# Patient Record
Sex: Female | Born: 1945
Health system: Southern US, Community
[De-identification: ages and names within clinical notes are randomized; demographics above are authoritative.]

## PROBLEM LIST (undated history)

## (undated) ENCOUNTER — Emergency Department (HOSPITAL_COMMUNITY): Admission: EM | Payer: Self-pay | Source: Home / Self Care

## (undated) DIAGNOSIS — I2119 ST elevation (STEMI) myocardial infarction involving other coronary artery of inferior wall: Secondary | ICD-10-CM

## (undated) DIAGNOSIS — J302 Other seasonal allergic rhinitis: Secondary | ICD-10-CM

## (undated) DIAGNOSIS — E785 Hyperlipidemia, unspecified: Secondary | ICD-10-CM

## (undated) DIAGNOSIS — Z720 Tobacco use: Secondary | ICD-10-CM

## (undated) DIAGNOSIS — I219 Acute myocardial infarction, unspecified: Secondary | ICD-10-CM

## (undated) DIAGNOSIS — I10 Essential (primary) hypertension: Secondary | ICD-10-CM

## (undated) DIAGNOSIS — I2510 Atherosclerotic heart disease of native coronary artery without angina pectoris: Secondary | ICD-10-CM

## (undated) DIAGNOSIS — Z955 Presence of coronary angioplasty implant and graft: Secondary | ICD-10-CM

## (undated) HISTORY — DX: ST elevation (STEMI) myocardial infarction involving other coronary artery of inferior wall: I21.19

## (undated) HISTORY — DX: Essential (primary) hypertension: I10

## (undated) HISTORY — DX: Presence of coronary angioplasty implant and graft: Z95.5

## (undated) HISTORY — PX: CATARACT EXTRACTION: SUR2

## (undated) HISTORY — PX: CHOLECYSTECTOMY: SHX55

## (undated) HISTORY — DX: Other seasonal allergic rhinitis: J30.2

## (undated) HISTORY — PX: COLONOSCOPY: SHX174

## (undated) HISTORY — PX: VAGINAL HYSTERECTOMY: SUR661

---

## 2012-08-21 DIAGNOSIS — E782 Mixed hyperlipidemia: Secondary | ICD-10-CM | POA: Diagnosis not present

## 2012-12-11 DIAGNOSIS — J0100 Acute maxillary sinusitis, unspecified: Secondary | ICD-10-CM | POA: Diagnosis not present

## 2013-02-01 DIAGNOSIS — J209 Acute bronchitis, unspecified: Secondary | ICD-10-CM | POA: Diagnosis not present

## 2013-02-20 DIAGNOSIS — R109 Unspecified abdominal pain: Secondary | ICD-10-CM | POA: Diagnosis not present

## 2013-02-20 DIAGNOSIS — G518 Other disorders of facial nerve: Secondary | ICD-10-CM | POA: Diagnosis not present

## 2013-04-07 DIAGNOSIS — Z79899 Other long term (current) drug therapy: Secondary | ICD-10-CM | POA: Diagnosis not present

## 2013-04-07 DIAGNOSIS — E7800 Pure hypercholesterolemia, unspecified: Secondary | ICD-10-CM | POA: Diagnosis not present

## 2013-04-09 DIAGNOSIS — R509 Fever, unspecified: Secondary | ICD-10-CM | POA: Diagnosis not present

## 2013-04-09 DIAGNOSIS — R112 Nausea with vomiting, unspecified: Secondary | ICD-10-CM | POA: Diagnosis not present

## 2013-09-11 DIAGNOSIS — Z1231 Encounter for screening mammogram for malignant neoplasm of breast: Secondary | ICD-10-CM | POA: Diagnosis not present

## 2013-09-15 DIAGNOSIS — J019 Acute sinusitis, unspecified: Secondary | ICD-10-CM | POA: Diagnosis not present

## 2013-11-17 DIAGNOSIS — J0100 Acute maxillary sinusitis, unspecified: Secondary | ICD-10-CM | POA: Diagnosis not present

## 2013-11-26 DIAGNOSIS — J0100 Acute maxillary sinusitis, unspecified: Secondary | ICD-10-CM | POA: Diagnosis not present

## 2013-11-26 DIAGNOSIS — J209 Acute bronchitis, unspecified: Secondary | ICD-10-CM | POA: Diagnosis not present

## 2014-05-11 DIAGNOSIS — Z79899 Other long term (current) drug therapy: Secondary | ICD-10-CM | POA: Diagnosis not present

## 2014-05-11 DIAGNOSIS — J0100 Acute maxillary sinusitis, unspecified: Secondary | ICD-10-CM | POA: Diagnosis not present

## 2014-05-11 DIAGNOSIS — E782 Mixed hyperlipidemia: Secondary | ICD-10-CM | POA: Diagnosis not present

## 2014-05-11 DIAGNOSIS — Z1389 Encounter for screening for other disorder: Secondary | ICD-10-CM | POA: Diagnosis not present

## 2014-05-11 DIAGNOSIS — M810 Age-related osteoporosis without current pathological fracture: Secondary | ICD-10-CM | POA: Diagnosis not present

## 2014-05-26 DIAGNOSIS — J0100 Acute maxillary sinusitis, unspecified: Secondary | ICD-10-CM | POA: Diagnosis not present

## 2014-07-24 DIAGNOSIS — J209 Acute bronchitis, unspecified: Secondary | ICD-10-CM | POA: Diagnosis not present

## 2014-07-24 DIAGNOSIS — R071 Chest pain on breathing: Secondary | ICD-10-CM | POA: Diagnosis not present

## 2014-07-24 DIAGNOSIS — J0100 Acute maxillary sinusitis, unspecified: Secondary | ICD-10-CM | POA: Diagnosis not present

## 2014-07-30 DIAGNOSIS — J208 Acute bronchitis due to other specified organisms: Secondary | ICD-10-CM | POA: Diagnosis not present

## 2014-07-30 DIAGNOSIS — B9689 Other specified bacterial agents as the cause of diseases classified elsewhere: Secondary | ICD-10-CM | POA: Diagnosis not present

## 2014-09-03 DIAGNOSIS — T50995A Adverse effect of other drugs, medicaments and biological substances, initial encounter: Secondary | ICD-10-CM | POA: Diagnosis not present

## 2014-09-03 DIAGNOSIS — R300 Dysuria: Secondary | ICD-10-CM | POA: Diagnosis not present

## 2014-09-09 DIAGNOSIS — J209 Acute bronchitis, unspecified: Secondary | ICD-10-CM | POA: Diagnosis not present

## 2014-09-16 DIAGNOSIS — J302 Other seasonal allergic rhinitis: Secondary | ICD-10-CM | POA: Diagnosis not present

## 2014-09-16 DIAGNOSIS — I10 Essential (primary) hypertension: Secondary | ICD-10-CM | POA: Diagnosis not present

## 2014-11-17 DIAGNOSIS — Z1231 Encounter for screening mammogram for malignant neoplasm of breast: Secondary | ICD-10-CM | POA: Diagnosis not present

## 2014-11-19 DIAGNOSIS — B373 Candidiasis of vulva and vagina: Secondary | ICD-10-CM | POA: Diagnosis not present

## 2014-11-19 DIAGNOSIS — N3001 Acute cystitis with hematuria: Secondary | ICD-10-CM | POA: Diagnosis not present

## 2014-11-19 DIAGNOSIS — M810 Age-related osteoporosis without current pathological fracture: Secondary | ICD-10-CM | POA: Diagnosis not present

## 2014-12-17 DIAGNOSIS — C44112 Basal cell carcinoma of skin of right eyelid, including canthus: Secondary | ICD-10-CM | POA: Diagnosis not present

## 2014-12-20 DIAGNOSIS — Z7982 Long term (current) use of aspirin: Secondary | ICD-10-CM | POA: Diagnosis not present

## 2014-12-20 DIAGNOSIS — Z79899 Other long term (current) drug therapy: Secondary | ICD-10-CM | POA: Diagnosis not present

## 2014-12-20 DIAGNOSIS — S299XXA Unspecified injury of thorax, initial encounter: Secondary | ICD-10-CM | POA: Diagnosis not present

## 2014-12-20 DIAGNOSIS — S80212A Abrasion, left knee, initial encounter: Secondary | ICD-10-CM | POA: Diagnosis not present

## 2014-12-20 DIAGNOSIS — E780 Pure hypercholesterolemia: Secondary | ICD-10-CM | POA: Diagnosis not present

## 2014-12-20 DIAGNOSIS — S8992XA Unspecified injury of left lower leg, initial encounter: Secondary | ICD-10-CM | POA: Diagnosis not present

## 2014-12-20 DIAGNOSIS — S51011A Laceration without foreign body of right elbow, initial encounter: Secondary | ICD-10-CM | POA: Diagnosis not present

## 2014-12-20 DIAGNOSIS — R0781 Pleurodynia: Secondary | ICD-10-CM | POA: Diagnosis not present

## 2014-12-20 DIAGNOSIS — M25562 Pain in left knee: Secondary | ICD-10-CM | POA: Diagnosis not present

## 2014-12-23 DIAGNOSIS — L03113 Cellulitis of right upper limb: Secondary | ICD-10-CM | POA: Diagnosis not present

## 2014-12-23 DIAGNOSIS — M25521 Pain in right elbow: Secondary | ICD-10-CM | POA: Diagnosis not present

## 2015-01-26 DIAGNOSIS — J209 Acute bronchitis, unspecified: Secondary | ICD-10-CM | POA: Diagnosis not present

## 2015-01-26 DIAGNOSIS — J0100 Acute maxillary sinusitis, unspecified: Secondary | ICD-10-CM | POA: Diagnosis not present

## 2015-02-02 DIAGNOSIS — J019 Acute sinusitis, unspecified: Secondary | ICD-10-CM | POA: Diagnosis not present

## 2015-02-02 DIAGNOSIS — B9689 Other specified bacterial agents as the cause of diseases classified elsewhere: Secondary | ICD-10-CM | POA: Diagnosis not present

## 2015-02-17 DIAGNOSIS — N3001 Acute cystitis with hematuria: Secondary | ICD-10-CM | POA: Diagnosis not present

## 2015-02-18 DIAGNOSIS — L570 Actinic keratosis: Secondary | ICD-10-CM | POA: Diagnosis not present

## 2015-05-04 DIAGNOSIS — H40013 Open angle with borderline findings, low risk, bilateral: Secondary | ICD-10-CM | POA: Diagnosis not present

## 2015-05-04 DIAGNOSIS — H2512 Age-related nuclear cataract, left eye: Secondary | ICD-10-CM | POA: Diagnosis not present

## 2015-05-11 DIAGNOSIS — M1990 Unspecified osteoarthritis, unspecified site: Secondary | ICD-10-CM | POA: Diagnosis not present

## 2015-05-11 DIAGNOSIS — F17210 Nicotine dependence, cigarettes, uncomplicated: Secondary | ICD-10-CM | POA: Diagnosis not present

## 2015-05-11 DIAGNOSIS — M810 Age-related osteoporosis without current pathological fracture: Secondary | ICD-10-CM | POA: Diagnosis not present

## 2015-05-11 DIAGNOSIS — E785 Hyperlipidemia, unspecified: Secondary | ICD-10-CM | POA: Diagnosis not present

## 2015-05-11 DIAGNOSIS — Z79899 Other long term (current) drug therapy: Secondary | ICD-10-CM | POA: Diagnosis not present

## 2015-05-11 DIAGNOSIS — H2512 Age-related nuclear cataract, left eye: Secondary | ICD-10-CM | POA: Diagnosis not present

## 2015-05-25 DIAGNOSIS — H2511 Age-related nuclear cataract, right eye: Secondary | ICD-10-CM | POA: Diagnosis not present

## 2015-05-25 DIAGNOSIS — Z79899 Other long term (current) drug therapy: Secondary | ICD-10-CM | POA: Diagnosis not present

## 2015-05-25 DIAGNOSIS — E785 Hyperlipidemia, unspecified: Secondary | ICD-10-CM | POA: Diagnosis not present

## 2015-05-25 DIAGNOSIS — M810 Age-related osteoporosis without current pathological fracture: Secondary | ICD-10-CM | POA: Diagnosis not present

## 2015-08-04 DIAGNOSIS — S92919A Unspecified fracture of unspecified toe(s), initial encounter for closed fracture: Secondary | ICD-10-CM | POA: Diagnosis not present

## 2015-08-04 DIAGNOSIS — S52509A Unspecified fracture of the lower end of unspecified radius, initial encounter for closed fracture: Secondary | ICD-10-CM | POA: Diagnosis not present

## 2015-08-05 DIAGNOSIS — S52501A Unspecified fracture of the lower end of right radius, initial encounter for closed fracture: Secondary | ICD-10-CM | POA: Diagnosis not present

## 2015-08-05 DIAGNOSIS — E785 Hyperlipidemia, unspecified: Secondary | ICD-10-CM | POA: Diagnosis not present

## 2015-08-05 DIAGNOSIS — Z79899 Other long term (current) drug therapy: Secondary | ICD-10-CM | POA: Diagnosis not present

## 2015-08-05 DIAGNOSIS — Z7982 Long term (current) use of aspirin: Secondary | ICD-10-CM | POA: Diagnosis not present

## 2015-08-05 DIAGNOSIS — S52571A Other intraarticular fracture of lower end of right radius, initial encounter for closed fracture: Secondary | ICD-10-CM | POA: Diagnosis not present

## 2015-08-05 DIAGNOSIS — Z8719 Personal history of other diseases of the digestive system: Secondary | ICD-10-CM | POA: Diagnosis not present

## 2015-08-05 DIAGNOSIS — Z87891 Personal history of nicotine dependence: Secondary | ICD-10-CM | POA: Diagnosis not present

## 2015-08-18 DIAGNOSIS — S52509A Unspecified fracture of the lower end of unspecified radius, initial encounter for closed fracture: Secondary | ICD-10-CM | POA: Diagnosis not present

## 2015-09-29 DIAGNOSIS — S52509A Unspecified fracture of the lower end of unspecified radius, initial encounter for closed fracture: Secondary | ICD-10-CM | POA: Diagnosis not present

## 2016-01-14 DIAGNOSIS — J309 Allergic rhinitis, unspecified: Secondary | ICD-10-CM | POA: Diagnosis not present

## 2016-01-14 DIAGNOSIS — Z79899 Other long term (current) drug therapy: Secondary | ICD-10-CM | POA: Diagnosis not present

## 2016-01-14 DIAGNOSIS — Z0001 Encounter for general adult medical examination with abnormal findings: Secondary | ICD-10-CM | POA: Diagnosis not present

## 2016-01-14 DIAGNOSIS — Z1389 Encounter for screening for other disorder: Secondary | ICD-10-CM | POA: Diagnosis not present

## 2016-01-14 DIAGNOSIS — Z9181 History of falling: Secondary | ICD-10-CM | POA: Diagnosis not present

## 2016-01-21 DIAGNOSIS — G501 Atypical facial pain: Secondary | ICD-10-CM | POA: Diagnosis not present

## 2016-01-21 DIAGNOSIS — M79643 Pain in unspecified hand: Secondary | ICD-10-CM | POA: Diagnosis not present

## 2016-01-24 ENCOUNTER — Other Ambulatory Visit: Payer: Self-pay

## 2016-01-24 DIAGNOSIS — S0083XS Contusion of other part of head, sequela: Secondary | ICD-10-CM | POA: Diagnosis not present

## 2016-03-05 DIAGNOSIS — J0100 Acute maxillary sinusitis, unspecified: Secondary | ICD-10-CM | POA: Diagnosis not present

## 2016-05-09 DIAGNOSIS — H9209 Otalgia, unspecified ear: Secondary | ICD-10-CM | POA: Diagnosis not present

## 2016-05-09 DIAGNOSIS — J0100 Acute maxillary sinusitis, unspecified: Secondary | ICD-10-CM | POA: Diagnosis not present

## 2016-07-26 DIAGNOSIS — E559 Vitamin D deficiency, unspecified: Secondary | ICD-10-CM | POA: Diagnosis not present

## 2016-07-26 DIAGNOSIS — R531 Weakness: Secondary | ICD-10-CM | POA: Diagnosis not present

## 2016-07-26 DIAGNOSIS — R634 Abnormal weight loss: Secondary | ICD-10-CM | POA: Diagnosis not present

## 2016-07-26 DIAGNOSIS — Z760 Encounter for issue of repeat prescription: Secondary | ICD-10-CM | POA: Diagnosis not present

## 2016-08-04 DIAGNOSIS — R634 Abnormal weight loss: Secondary | ICD-10-CM | POA: Diagnosis not present

## 2016-08-04 DIAGNOSIS — K8020 Calculus of gallbladder without cholecystitis without obstruction: Secondary | ICD-10-CM | POA: Diagnosis not present

## 2016-08-21 DIAGNOSIS — K8020 Calculus of gallbladder without cholecystitis without obstruction: Secondary | ICD-10-CM | POA: Diagnosis not present

## 2016-08-21 DIAGNOSIS — Z6823 Body mass index (BMI) 23.0-23.9, adult: Secondary | ICD-10-CM | POA: Diagnosis not present

## 2016-08-29 DIAGNOSIS — K8010 Calculus of gallbladder with chronic cholecystitis without obstruction: Secondary | ICD-10-CM | POA: Insufficient documentation

## 2016-08-29 DIAGNOSIS — R1011 Right upper quadrant pain: Secondary | ICD-10-CM | POA: Insufficient documentation

## 2016-09-06 DIAGNOSIS — K8010 Calculus of gallbladder with chronic cholecystitis without obstruction: Secondary | ICD-10-CM | POA: Diagnosis not present

## 2016-09-06 DIAGNOSIS — Z01818 Encounter for other preprocedural examination: Secondary | ICD-10-CM | POA: Diagnosis not present

## 2016-09-06 DIAGNOSIS — K8020 Calculus of gallbladder without cholecystitis without obstruction: Secondary | ICD-10-CM | POA: Diagnosis not present

## 2016-11-03 DIAGNOSIS — S30860A Insect bite (nonvenomous) of lower back and pelvis, initial encounter: Secondary | ICD-10-CM | POA: Diagnosis not present

## 2016-11-03 DIAGNOSIS — R509 Fever, unspecified: Secondary | ICD-10-CM | POA: Diagnosis not present

## 2016-11-17 ENCOUNTER — Inpatient Hospital Stay (HOSPITAL_COMMUNITY)
Admission: EM | Admit: 2016-11-17 | Discharge: 2016-11-19 | DRG: 247 | Disposition: A | Discharge status: Home / Self Care | Payer: 59 | Attending: Cardiovascular Disease | Admitting: Cardiovascular Disease

## 2016-11-17 ENCOUNTER — Encounter (HOSPITAL_COMMUNITY): Payer: Self-pay | Admitting: Cardiovascular Disease

## 2016-11-17 ENCOUNTER — Encounter (HOSPITAL_COMMUNITY)
Admission: EM | Disposition: A | Discharge status: Home / Self Care | Payer: Self-pay | Source: Home / Self Care | Attending: Cardiovascular Disease

## 2016-11-17 DIAGNOSIS — Z79899 Other long term (current) drug therapy: Secondary | ICD-10-CM | POA: Diagnosis not present

## 2016-11-17 DIAGNOSIS — E7800 Pure hypercholesterolemia, unspecified: Secondary | ICD-10-CM | POA: Diagnosis not present

## 2016-11-17 DIAGNOSIS — F17210 Nicotine dependence, cigarettes, uncomplicated: Secondary | ICD-10-CM | POA: Diagnosis present

## 2016-11-17 DIAGNOSIS — Z955 Presence of coronary angioplasty implant and graft: Secondary | ICD-10-CM

## 2016-11-17 DIAGNOSIS — Z720 Tobacco use: Secondary | ICD-10-CM | POA: Diagnosis not present

## 2016-11-17 DIAGNOSIS — I360 Nonrheumatic tricuspid (valve) stenosis: Secondary | ICD-10-CM | POA: Diagnosis not present

## 2016-11-17 DIAGNOSIS — I2119 ST elevation (STEMI) myocardial infarction involving other coronary artery of inferior wall: Principal | ICD-10-CM

## 2016-11-17 DIAGNOSIS — E785 Hyperlipidemia, unspecified: Secondary | ICD-10-CM | POA: Diagnosis present

## 2016-11-17 DIAGNOSIS — I2510 Atherosclerotic heart disease of native coronary artery without angina pectoris: Secondary | ICD-10-CM | POA: Diagnosis present

## 2016-11-17 DIAGNOSIS — R079 Chest pain, unspecified: Secondary | ICD-10-CM | POA: Diagnosis not present

## 2016-11-17 DIAGNOSIS — I2111 ST elevation (STEMI) myocardial infarction involving right coronary artery: Secondary | ICD-10-CM | POA: Diagnosis not present

## 2016-11-17 DIAGNOSIS — Z7982 Long term (current) use of aspirin: Secondary | ICD-10-CM | POA: Diagnosis not present

## 2016-11-17 HISTORY — DX: Hyperlipidemia, unspecified: E78.5

## 2016-11-17 HISTORY — PX: CORONARY STENT INTERVENTION: CATH118234

## 2016-11-17 HISTORY — DX: Tobacco use: Z72.0

## 2016-11-17 HISTORY — DX: Atherosclerotic heart disease of native coronary artery without angina pectoris: I25.10

## 2016-11-17 HISTORY — PX: LEFT HEART CATH AND CORONARY ANGIOGRAPHY: CATH118249

## 2016-11-17 LAB — CBC
HEMATOCRIT: 35.4 % — AB (ref 36.0–46.0)
Hemoglobin: 11.6 g/dL — ABNORMAL LOW (ref 12.0–15.0)
MCH: 29.7 pg (ref 26.0–34.0)
MCHC: 32.8 g/dL (ref 30.0–36.0)
MCV: 90.8 fL (ref 78.0–100.0)
Platelets: 240 10*3/uL (ref 150–400)
RBC: 3.9 MIL/uL (ref 3.87–5.11)
RDW: 12.7 % (ref 11.5–15.5)
WBC: 10.6 10*3/uL — AB (ref 4.0–10.5)

## 2016-11-17 SURGERY — LEFT HEART CATH AND CORONARY ANGIOGRAPHY
Anesthesia: LOCAL

## 2016-11-17 MED ORDER — DIAZEPAM 5 MG PO TABS
5.0000 mg | ORAL_TABLET | Freq: Four times a day (QID) | ORAL | Status: DC | PRN
  Administered 2016-11-18: 5 mg via ORAL
  Filled 2016-11-17: qty 1

## 2016-11-17 MED ORDER — MIDAZOLAM HCL 2 MG/2ML IJ SOLN
INTRAMUSCULAR | Status: DC | PRN
  Administered 2016-11-17: 1 mg via INTRAVENOUS

## 2016-11-17 MED ORDER — MIDAZOLAM HCL 2 MG/2ML IJ SOLN
INTRAMUSCULAR | Status: AC
  Filled 2016-11-17: qty 2

## 2016-11-17 MED ORDER — TICAGRELOR 90 MG PO TABS
ORAL_TABLET | ORAL | Status: DC | PRN
  Administered 2016-11-17: 180 mg via ORAL

## 2016-11-17 MED ORDER — ASPIRIN 81 MG PO CHEW
81.0000 mg | CHEWABLE_TABLET | Freq: Every day | ORAL | Status: DC
  Administered 2016-11-18 – 2016-11-19 (×2): 81 mg via ORAL
  Filled 2016-11-17 (×2): qty 1

## 2016-11-17 MED ORDER — METOPROLOL TARTRATE 25 MG PO TABS
25.0000 mg | ORAL_TABLET | Freq: Two times a day (BID) | ORAL | Status: DC
  Administered 2016-11-18 – 2016-11-19 (×4): 25 mg via ORAL
  Filled 2016-11-17: qty 1
  Filled 2016-11-17 (×2): qty 2
  Filled 2016-11-17: qty 1

## 2016-11-17 MED ORDER — NITROGLYCERIN 1 MG/10 ML FOR IR/CATH LAB
INTRA_ARTERIAL | Status: DC | PRN
  Administered 2016-11-17: 200 ug via INTRACORONARY

## 2016-11-17 MED ORDER — SODIUM CHLORIDE 0.9 % IV SOLN: 250.0000 mL | INTRAVENOUS | Status: DC | PRN

## 2016-11-17 MED ORDER — VERAPAMIL HCL 2.5 MG/ML IV SOLN
INTRAVENOUS | Status: AC
  Filled 2016-11-17: qty 2

## 2016-11-17 MED ORDER — LABETALOL HCL 5 MG/ML IV SOLN: 10.0000 mg | INTRAVENOUS | Status: AC | PRN

## 2016-11-17 MED ORDER — OXYCODONE-ACETAMINOPHEN 5-325 MG PO TABS: 1.0000 | ORAL_TABLET | ORAL | Status: DC | PRN

## 2016-11-17 MED ORDER — SODIUM CHLORIDE 0.9% FLUSH: 3.0000 mL | INTRAVENOUS | Status: DC | PRN

## 2016-11-17 MED ORDER — BIVALIRUDIN BOLUS VIA INFUSION - CUPID
INTRAVENOUS | Status: DC | PRN
  Administered 2016-11-17: 44.25 mg via INTRAVENOUS

## 2016-11-17 MED ORDER — HEPARIN (PORCINE) IN NACL 2-0.9 UNIT/ML-% IJ SOLN
INTRAMUSCULAR | Status: AC | PRN
  Administered 2016-11-17: 1000 mL via INTRA_ARTERIAL

## 2016-11-17 MED ORDER — FENTANYL CITRATE (PF) 100 MCG/2ML IJ SOLN
INTRAMUSCULAR | Status: DC | PRN
  Administered 2016-11-17 (×2): 25 ug via INTRAVENOUS

## 2016-11-17 MED ORDER — SODIUM CHLORIDE 0.9% FLUSH
3.0000 mL | Freq: Two times a day (BID) | INTRAVENOUS | Status: DC
  Administered 2016-11-18 (×3): 3 mL via INTRAVENOUS

## 2016-11-17 MED ORDER — BIVALIRUDIN TRIFLUOROACETATE 250 MG IV SOLR
INTRAVENOUS | Status: AC
  Filled 2016-11-17: qty 250

## 2016-11-17 MED ORDER — LIDOCAINE HCL (PF) 1 % IJ SOLN
INTRAMUSCULAR | Status: DC | PRN
  Administered 2016-11-17: 2 mL via INTRADERMAL
  Administered 2016-11-17: 16 mL via INTRADERMAL

## 2016-11-17 MED ORDER — TICAGRELOR 90 MG PO TABS
90.0000 mg | ORAL_TABLET | Freq: Two times a day (BID) | ORAL | Status: DC
  Administered 2016-11-18 – 2016-11-19 (×3): 90 mg via ORAL
  Filled 2016-11-17 (×4): qty 1

## 2016-11-17 MED ORDER — HEPARIN (PORCINE) IN NACL 2-0.9 UNIT/ML-% IJ SOLN
INTRAMUSCULAR | Status: AC
  Filled 2016-11-17: qty 1000

## 2016-11-17 MED ORDER — ACETAMINOPHEN 325 MG PO TABS
650.0000 mg | ORAL_TABLET | ORAL | Status: DC | PRN
  Administered 2016-11-18: 650 mg via ORAL
  Filled 2016-11-17: qty 2

## 2016-11-17 MED ORDER — ATORVASTATIN CALCIUM 80 MG PO TABS
80.0000 mg | ORAL_TABLET | Freq: Every day | ORAL | Status: DC
  Administered 2016-11-18 (×2): 80 mg via ORAL
  Filled 2016-11-17 (×2): qty 1

## 2016-11-17 MED ORDER — SODIUM CHLORIDE 0.9 % IV SOLN
INTRAVENOUS | Status: DC | PRN
  Administered 2016-11-17: 1.75 mg/kg/h via INTRAVENOUS

## 2016-11-17 MED ORDER — HEPARIN SODIUM (PORCINE) 1000 UNIT/ML IJ SOLN
INTRAMUSCULAR | Status: AC
  Filled 2016-11-17: qty 1

## 2016-11-17 MED ORDER — MORPHINE SULFATE (PF) 4 MG/ML IV SOLN: 2.0000 mg | INTRAVENOUS | Status: DC | PRN

## 2016-11-17 MED ORDER — NITROGLYCERIN 1 MG/10 ML FOR IR/CATH LAB
INTRA_ARTERIAL | Status: AC
  Filled 2016-11-17: qty 10

## 2016-11-17 MED ORDER — LIDOCAINE HCL (PF) 1 % IJ SOLN
INTRAMUSCULAR | Status: AC
  Filled 2016-11-17: qty 30

## 2016-11-17 MED ORDER — FENTANYL CITRATE (PF) 100 MCG/2ML IJ SOLN
INTRAMUSCULAR | Status: AC
  Filled 2016-11-17: qty 2

## 2016-11-17 MED ORDER — SODIUM CHLORIDE 0.9 % IV SOLN: INTRAVENOUS | Status: AC

## 2016-11-17 MED ORDER — IOPAMIDOL (ISOVUE-370) INJECTION 76%
INTRAVENOUS | Status: DC | PRN
  Administered 2016-11-17: 105 mL via INTRA_ARTERIAL

## 2016-11-17 MED ORDER — ONDANSETRON HCL 4 MG/2ML IJ SOLN: 4.0000 mg | Freq: Four times a day (QID) | INTRAMUSCULAR | Status: DC | PRN

## 2016-11-17 MED ORDER — HYDRALAZINE HCL 20 MG/ML IJ SOLN: 5.0000 mg | INTRAMUSCULAR | Status: AC | PRN

## 2016-11-17 SURGICAL SUPPLY — 23 items
BALLN EUPHORA RX 2.5X12 (BALLOONS) ×2
BALLN ~~LOC~~ EUPHORA RX 4.5X12 (BALLOONS) ×2
BALLOON EUPHORA RX 2.5X12 (BALLOONS) ×1 IMPLANT
BALLOON ~~LOC~~ EUPHORA RX 4.5X12 (BALLOONS) ×1 IMPLANT
CATH EXPO 5FR ANG PIGTAIL 145 (CATHETERS) ×2 IMPLANT
CATH INFINITI 5FR JL4 (CATHETERS) ×2 IMPLANT
CATH INFINITI JR4 5F (CATHETERS) ×2 IMPLANT
CATH VISTA GUIDE 6FR JR4 (CATHETERS) ×2 IMPLANT
DEVICE RAD COMP TR BAND LRG (VASCULAR PRODUCTS) ×2 IMPLANT
ELECT DEFIB PAD ADLT CADENCE (PAD) ×2 IMPLANT
GLIDESHEATH SLEND SS 6F .021 (SHEATH) ×2 IMPLANT
GUIDEWIRE INQWIRE 1.5J.035X260 (WIRE) ×1 IMPLANT
INQWIRE 1.5J .035X260CM (WIRE) ×2
KIT ENCORE 26 ADVANTAGE (KITS) ×2 IMPLANT
KIT HEART LEFT (KITS) ×2 IMPLANT
PACK CARDIAC CATHETERIZATION (CUSTOM PROCEDURE TRAY) ×2 IMPLANT
SHEATH PINNACLE 6F 10CM (SHEATH) ×2 IMPLANT
STENT RESOLUTE ONYX 4.5X18 (Permanent Stent) ×2 IMPLANT
SYR MEDRAD MARK V 150ML (SYRINGE) ×2 IMPLANT
TRANSDUCER W/STOPCOCK (MISCELLANEOUS) ×2 IMPLANT
TUBING CIL FLEX 10 FLL-RA (TUBING) ×2 IMPLANT
WIRE COUGAR XT STRL 190CM (WIRE) ×2 IMPLANT
WIRE EMERALD 3MM-J .035X150CM (WIRE) ×2 IMPLANT

## 2016-11-17 NOTE — H&P (Signed)
     Patient ID: Tiffany Wright MRN: [id number] DOB/AGE: 1945/08/14 71 y.o. Admit date: 11/17/2016  Primary Care Physician: Ann Held [facility]) Primary Cardiologist: New  HPI: 71 yo female with history of tobacco abuse, HLD who is admitted tonight with an acute inferior STEMI. Onset of chest pain one hour before arrival here at [facility]. She called EMS immediately. First EKG with no ST changes. Third EKG with 1 mm ST segment elevation. Code STEMI activated by EMS. Pt with ongoing pain upon arrival in [facility] ED via EMS.   Review of systems complete and found to be negative unless listed above   Past Medical History:  Diagnosis Date  . Hyperlipidemia   . Tobacco abuse     Family History  Problem Relation Age of Onset  . Heart attack Brother     Social History   Social History  . Marital status: N/A    Spouse name: N/A  . Number of children: N/A  . Years of education: N/A   Occupational History  . Not on file.   Social History Main Topics  . Smoking status: Current Every Day Smoker    Packs/day: 1.00    Years: 30.00    Types: Cigarettes  . Smokeless tobacco: Never Used  . Alcohol use No  . Drug use: No  . Sexual activity: Not on file   Other Topics Concern  . Not on file   Social History Narrative  . No narrative on file    Past Surgical History:  Procedure Laterality Date  . CHOLECYSTECTOMY      NKDA  Prior to Admission Meds:  Zocor-? Dose  Physical Exam: Blood pressure (!) 142/86, pulse 87, resp. rate 18, SpO2 96 %.    General: Well developed, well nourished, appears uncomfortable HEENT: OP clear, mucus membranes moist  SKIN: warm, dry. No rashes.  Neuro: No focal deficits  Musculoskeletal: Muscle strength 5/5 all ext  Psychiatric: Mood and affect normal  Neck: No JVD, no carotid bruits, no thyromegaly, no lymphadenopathy.  Lungs:Clear bilaterally, no wheezes, rhonci, crackles  Cardiovascular: Regular rate and rhythm. No murmurs, gallops or rubs.    Abdomen:Soft. Bowel sounds present. Non-tender.  Extremities: No lower extremity edema. Pulses are 2 + in the bilateral DP/PT.  Labs:pending  EKG: sinus, 1 mm ST elevation inferior leads  ASSESSMENT AND PLAN:   1. Acute inferior STEMI: Pt with ongoing chest pain. Plan emergent cardiac cath. Further plans to follow after cath.   2. Tobacco abuse: Smoking cessation is discussed  3. Hyperlipidemia: Will start high dose statin tonight. FLP in am.   [name], MD 11/17/2016, 11:45 PM

## 2016-11-18 ENCOUNTER — Inpatient Hospital Stay (HOSPITAL_COMMUNITY): Payer: 59

## 2016-11-18 ENCOUNTER — Encounter (HOSPITAL_COMMUNITY): Payer: Self-pay

## 2016-11-18 DIAGNOSIS — E7800 Pure hypercholesterolemia, unspecified: Secondary | ICD-10-CM

## 2016-11-18 DIAGNOSIS — I2119 ST elevation (STEMI) myocardial infarction involving other coronary artery of inferior wall: Principal | ICD-10-CM

## 2016-11-18 DIAGNOSIS — Z720 Tobacco use: Secondary | ICD-10-CM

## 2016-11-18 DIAGNOSIS — I360 Nonrheumatic tricuspid (valve) stenosis: Secondary | ICD-10-CM

## 2016-11-18 LAB — COMPREHENSIVE METABOLIC PANEL
ALK PHOS: 70 U/L (ref 38–126)
ALT: 16 U/L (ref 14–54)
AST: 23 U/L (ref 15–41)
Albumin: 3.3 g/dL — ABNORMAL LOW (ref 3.5–5.0)
Anion gap: 7 (ref 5–15)
BILIRUBIN TOTAL: 0.5 mg/dL (ref 0.3–1.2)
BUN: 16 mg/dL (ref 6–20)
CALCIUM: 9.5 mg/dL (ref 8.9–10.3)
CO2: 22 mmol/L (ref 22–32)
CREATININE: 0.56 mg/dL (ref 0.44–1.00)
Chloride: 104 mmol/L (ref 101–111)
Glucose, Bld: 117 mg/dL — ABNORMAL HIGH (ref 65–99)
Potassium: 3.3 mmol/L — ABNORMAL LOW (ref 3.5–5.1)
Sodium: 133 mmol/L — ABNORMAL LOW (ref 135–145)
Total Protein: 5.2 g/dL — ABNORMAL LOW (ref 6.5–8.1)

## 2016-11-18 LAB — ECHOCARDIOGRAM COMPLETE
AVLVOTPG: 6 mmHg
E decel time: 218 msec
FS: 33 % (ref 28–44)
HEIGHTINCHES: 60 in
IVS/LV PW RATIO, ED: 1
LA ID, A-P, ES: 30 mm
LA diam end sys: 30 mm
LA vol A4C: 20.2 ml
LA vol index: 14.1 mL/m2
LA vol: 21.9 mL
LADIAMINDEX: 1.94 cm/m2
LV PW d: 9 mm — AB (ref 0.6–1.1)
LVELAT: 6.53 cm/s
LVOT SV: 52 mL
LVOT VTI: 22.7 cm
LVOT area: 2.27 cm2
LVOT diameter: 17 mm
LVOT peak vel: 126 cm/s
Lateral S' vel: 9.68 cm/s
MV Dec: 218
MVPKEVEL: 0.7 m/s
P 1/2 time: 756 ms
RV TAPSE: 13.4 mm
RV sys press: 3 mmHg
Reg peak vel: 2.18 cm/s
TDI e' lateral: 6.53
TDI e' medial: 5.55
TRMAXVEL: 2.18 cm/s
WEIGHTICAEL: 2067.03 [oz_av]

## 2016-11-18 LAB — BASIC METABOLIC PANEL
Anion gap: 9 (ref 5–15)
BUN: 11 mg/dL (ref 6–20)
CO2: 23 mmol/L (ref 22–32)
CREATININE: 0.53 mg/dL (ref 0.44–1.00)
Calcium: 9.8 mg/dL (ref 8.9–10.3)
Chloride: 108 mmol/L (ref 101–111)
GFR calc non Af Amer: >60 mL/min (ref >60)
GLUCOSE: 105 mg/dL — AB (ref 65–99)
Potassium: 3.6 mmol/L (ref 3.5–5.1)
Sodium: 140 mmol/L (ref 135–145)

## 2016-11-18 LAB — CBC
HCT: 41 % (ref 36.0–46.0)
HCT: 45.7 % (ref 36.0–46.0)
HEMOGLOBIN: 15 g/dL (ref 12.0–15.0)
Hemoglobin: 13.2 g/dL (ref 12.0–15.0)
MCH: 29.3 pg (ref 26.0–34.0)
MCH: 30.1 pg (ref 26.0–34.0)
MCHC: 32.2 g/dL (ref 30.0–36.0)
MCHC: 32.8 g/dL (ref 30.0–36.0)
MCV: 90.9 fL (ref 78.0–100.0)
MCV: 91.6 fL (ref 78.0–100.0)
PLATELETS: 244 10*3/uL (ref 150–400)
PLATELETS: 248 10*3/uL (ref 150–400)
RBC: 4.51 MIL/uL (ref 3.87–5.11)
RBC: 4.99 MIL/uL (ref 3.87–5.11)
RDW: 12.6 % (ref 11.5–15.5)
RDW: 12.7 % (ref 11.5–15.5)
WBC: 11.1 10*3/uL — ABNORMAL HIGH (ref 4.0–10.5)
WBC: 12.2 10*3/uL — AB (ref 4.0–10.5)

## 2016-11-18 LAB — LIPID PANEL
CHOLESTEROL: 190 mg/dL (ref 0–200)
HDL: 40 mg/dL — ABNORMAL LOW (ref >40)
LDL CALC: 129 mg/dL — AB (ref 0–99)
Total CHOL/HDL Ratio: 4.8 RATIO
Triglycerides: 106 mg/dL (ref <150)
VLDL: 21 mg/dL (ref 0–40)

## 2016-11-18 LAB — POCT ACTIVATED CLOTTING TIME: Activated Clotting Time: 158 seconds

## 2016-11-18 LAB — TROPONIN I
Troponin I: 0.44 ng/mL (ref <0.03)
Troponin I: 1.38 ng/mL (ref <0.03)
Troponin I: 1.27 ng/mL (ref <0.03)

## 2016-11-18 LAB — MRSA PCR SCREENING: MRSA by PCR: NEGATIVE

## 2016-11-18 MED ORDER — HEART ATTACK BOUNCING BOOK
Freq: Once | Status: AC
  Administered 2016-11-18: 09:00:00
  Filled 2016-11-18: qty 1

## 2016-11-18 MED ORDER — NICOTINE 21 MG/24HR TD PT24
21.0000 mg | MEDICATED_PATCH | Freq: Every day | TRANSDERMAL | Status: DC
  Administered 2016-11-18 – 2016-11-19 (×2): 21 mg via TRANSDERMAL
  Filled 2016-11-18 (×2): qty 1

## 2016-11-18 MED ORDER — NICOTINE 21 MG/24HR TD PT24
21.0000 mg | MEDICATED_PATCH | Freq: Every day | TRANSDERMAL | Status: DC
  Administered 2016-11-18: 21 mg via TRANSDERMAL
  Filled 2016-11-18: qty 1

## 2016-11-18 MED ORDER — ATROPINE SULFATE 1 MG/10ML IJ SOSY
PREFILLED_SYRINGE | INTRAMUSCULAR | Status: AC
  Filled 2016-11-18: qty 10

## 2016-11-18 NOTE — Progress Notes (Signed)
CARDIAC REHAB PHASE I   PRE:  Rate/Rhythm: 81 SR  BP:  Sitting: 128/64        SaO2: 100 RA  MODE:  Ambulation: 550 ft   POST:  Rate/Rhythm: 80 SR  BP:  Sitting: 130/69         SaO2: 100 RA  Pt ambulated 550 ft on RA, handheld assist, steady gait, tolerated well with no complaints. Completed MI/stent education with pt and family at bedside.  Reviewed risk factors, tobacco cessation, MI book, anti-platelet therapy, stent card, activity restrictions, ntg, exercise, heart healthy diet,and phase 2 cardiac rehab. Pt verbalized understanding, receptive to education. Pt agrees to phase 2 cardiac rehab, will send to [facility]. Pt to see case manager regarding brilinta prior to discharge, RN aware. Pt to edge of bed per pt request after walk, call bell within reach.  [place], RN, BSN 11/18/2016 2:26 PM

## 2016-11-18 NOTE — Progress Notes (Signed)
  Echocardiogram 2D Echocardiogram has been performed.  Tiffany Wright 11/18/2016, 4:01 PM

## 2016-11-18 NOTE — Progress Notes (Signed)
Chaplain approached family in ED waiting area and escorted them to [place], per Santiago Glad.  Met cath doctor en route who had been looking for them at that moment.  Offered ongoing support as needed.  Please call anytime.  Luana Shu [phone]    11/18/16 0000  Clinical Encounter Type  Visited With Family  Visit Type Initial  Consult/Referral To Chaplain  Stress Factors  Family Stress Factors Lack of knowledge

## 2016-11-18 NOTE — Progress Notes (Signed)
Progress Note  Patient Name: Tiffany Wright Date of Encounter: 11/18/2016  Primary Cardiologist: Dr. Angelena Form (new)  Subjective   Feeling well. Denies chest pain or shortness of breath. She has no pain access site. She does note that her breathing feels slightly labored   labored.Inpatient Medications    Scheduled Meds: . aspirin  81 mg Oral Daily  . atorvastatin  80 mg Oral q1800  . metoprolol tartrate  25 mg Oral BID  . nicotine  21 mg Transdermal Daily  . sodium chloride flush  3 mL Intravenous Q12H  . ticagrelor  90 mg Oral BID   Continuous Infusions: . sodium chloride     PRN Meds: sodium chloride, acetaminophen, diazepam, morphine injection, ondansetron (ZOFRAN) IV, oxyCODONE-acetaminophen, sodium chloride flush   Vital Signs    Vitals:   11/18/16 0430 11/18/16 0500 11/18/16 0530 11/18/16 0600  BP: (!) 98/59 118/68 (!) 110/96 (!) 96/54  Pulse: 60 62 72 63  Resp: 16 17 (!) 22 17  Temp:      TempSrc:      SpO2: 100% 100% 100% 100%  Weight:      Height:        Intake/Output Summary (Last 24 hours) at 11/18/16 0735 Last data filed at 11/18/16 0600  Gross per 24 hour  Intake              590 ml  Output             1150 ml  Net             -560 ml   Filed Weights   11/18/16 0000  Weight: 58.6 kg (129 lb 3 oz)    Telemetry    No events - Personally Reviewed  ECG    11/17/16: Sinus rhythm. Rate 79 bpm. Prior inferior infarct.  Incomplete right bundle branch block.  - Personally Reviewed  Physical Exam   GEN: Well-appearing.  No acute distress.   Neck: No JVD Cardiac: RRR, no murmurs, rubs, or gallops.  Respiratory:  mild expiratory wheezes.  GI: Soft, nontender, non-distended  MS: No edema; No deformity. Neuro:  Nonfocal  Psych: Normal affect  Ext: Right groin without bruit or hematoma. Left wrist without ecchymoses or hematoma.   Labs    Chemistry Recent Labs Lab 11/17/16 2254  NA 133*  K 3.3*  CL 104  CO2 22  GLUCOSE 117*  BUN 16    CREATININE 0.56  CALCIUM 9.5  PROT 5.2*  ALBUMIN 3.3*  AST 23  ALT 16  ALKPHOS 70  BILITOT 0.5  GFRNONAA >60  GFRAA >60  ANIONGAP 7     Hematology Recent Labs Lab 11/17/16 2254  WBC 10.6*  RBC 3.90  HGB 11.6*  HCT 35.4*  MCV 90.8  MCH 29.7  MCHC 32.8  RDW 12.7  PLT 240    Cardiac Enzymes Recent Labs Lab 11/17/16 2254  TROPONINI 0.44*   No results for input(s): TROPIPOC in the last 168 hours.   BNPNo results for input(s): BNP, PROBNP in the last 168 hours.   DDimer No results for input(s): DDIMER in the last 168 hours.   Radiology    No results found.  Cardiac Studies   Echo pending  LHC 11/17/16:   Mid Cx lesion, 50 %stenosed.  Ost Cx to Prox Cx lesion, 20 %stenosed.  Prox Cx to Mid Cx lesion, 50 %stenosed.  Prox LAD to Mid LAD lesion, 50 %stenosed.  Ost 1st Diag to 1st Diag lesion, 40 %  stenosed.  Dist LAD lesion, 20 %stenosed.  Mid RCA lesion, 60 %stenosed.  A STENT RESOLUTE ONYX 4.5X18 drug eluting stent was successfully placed.  Prox RCA lesion, 99 %stenosed.  Post intervention, there is a 0% residual stenosis.  The left ventricular systolic function is normal.  LV end diastolic pressure is normal.  The left ventricular ejection fraction is greater than 65% by visual estimate.  There is no mitral valve regurgitation.   1. Acute inferior STEMI secondary to severe stenosis proximal RCA 2. Successful PTCA/DES x 1 proximal RCA 3. Moderate stenosis mid LAD 4. Moderate stenosis distal Circumflex 5. Moderate stenosis mid RCA 6. Normal LV systolic function  Patient Profile     71 y.o. female with hyperlipidemia and tobacco abuse here with inferior STEMI.  Assessment & Plan    # STEMI:  Ms. Lesueur had an inferior STEMI that was successfully treated with 1 drug-eluting stent in the RCA. She is doing well this a.m.  She does report some mildly labored breathing. This may be related to her ticagrelor. She is not volume overloaded and  her LVEF was greater than 65%.  Continue to monitor for now.  Continue DAPT for 1 year.  Continue metoprolol and atorvastatin.  # Tobacco abuse:  Encourage smoking cessation. Nicotine patch in place.   # Hyperlipidemia: LDL 129.  Started atorvastatin 80 mg daily.  She will need lipids and a CMP checked in 6 weeks.  Signed, Skeet Latch, MD  11/18/2016, 7:35 AM

## 2016-11-18 NOTE — Progress Notes (Signed)
11/18/2016 1045 Received pt to room 2W12 from 4N.  Pt is A&O, no C/O voiced.  Tele monitor applied and CCMD notified.  Oriented to room, call light and bed.  Call bell in reach. [place]

## 2016-11-18 NOTE — Progress Notes (Signed)
Site area: right groin  Site Prior to Removal:  Level 0  Pressure Applied For 20 MINUTES , plus additional 3 minutes on small hematoma  Minutes Beginning at 0204  Manual:   Yes.    Patient Status During Pull:  Pt stable with good BP and HR, a little anxious and restless  Post Pull Groin Site:  Level 1  Post Pull Instructions Given:  Yes.    Post Pull Pulses Present:  Yes.    Dressing Applied:  Yes.    Comments:  Site is level 1 post sheath removal, small amount of bruising and 1 cm hematoma marked; 2 RNs present the entire time

## 2016-11-19 ENCOUNTER — Encounter (HOSPITAL_COMMUNITY): Payer: Self-pay | Admitting: Physician Assistant

## 2016-11-19 DIAGNOSIS — Z720 Tobacco use: Secondary | ICD-10-CM | POA: Diagnosis present

## 2016-11-19 DIAGNOSIS — E785 Hyperlipidemia, unspecified: Secondary | ICD-10-CM | POA: Diagnosis present

## 2016-11-19 DIAGNOSIS — I2510 Atherosclerotic heart disease of native coronary artery without angina pectoris: Secondary | ICD-10-CM | POA: Diagnosis present

## 2016-11-19 DIAGNOSIS — Z955 Presence of coronary angioplasty implant and graft: Secondary | ICD-10-CM

## 2016-11-19 LAB — BASIC METABOLIC PANEL
Anion gap: 10 (ref 5–15)
BUN: 9 mg/dL (ref 6–20)
CALCIUM: 9.6 mg/dL (ref 8.9–10.3)
CO2: 21 mmol/L — ABNORMAL LOW (ref 22–32)
CREATININE: 0.52 mg/dL (ref 0.44–1.00)
Chloride: 108 mmol/L (ref 101–111)
GFR calc Af Amer: >60 mL/min (ref >60)
GLUCOSE: 96 mg/dL (ref 65–99)
Potassium: 4.1 mmol/L (ref 3.5–5.1)
Sodium: 139 mmol/L (ref 135–145)

## 2016-11-19 MED ORDER — ASPIRIN 81 MG PO CHEW: 81.0000 mg | CHEWABLE_TABLET | Freq: Every day | ORAL | Status: DC

## 2016-11-19 MED ORDER — ATORVASTATIN CALCIUM 80 MG PO TABS: 80.0000 mg | ORAL_TABLET | Freq: Every day | ORAL | 3 refills | Status: DC

## 2016-11-19 MED ORDER — NICOTINE 21 MG/24HR TD PT24: 21.0000 mg | MEDICATED_PATCH | Freq: Every day | TRANSDERMAL | 12 refills | Status: DC

## 2016-11-19 MED ORDER — TICAGRELOR 90 MG PO TABS: 90.0000 mg | ORAL_TABLET | Freq: Two times a day (BID) | ORAL | 4 refills | Status: DC

## 2016-11-19 MED ORDER — METOPROLOL TARTRATE 25 MG PO TABS: 25.0000 mg | ORAL_TABLET | Freq: Two times a day (BID) | ORAL | 6 refills | Status: DC

## 2016-11-19 NOTE — Progress Notes (Signed)
Discussed with the patient and all questioned fully answered. She will call me if any problems arise.  IV removed. Telemetry removed.   Jubilee Vivero S [place], RN   

## 2016-11-19 NOTE — Discharge Summary (Signed)
Discharge Summary    Patient ID: Tiffany Wright,  MRN: [id number], DOB/AGE: [birth year] 71 y.o.  Admit date: 11/17/2016 Discharge date: 11/19/2016  Primary Care Provider: No primary care provider on file. Primary Cardiologist: Dr. Angelena Form   Discharge Diagnoses    Principal Problem:   Acute ST elevation myocardial infarction (STEMI) of inferior wall (HCC) Active Problems:   CAD (coronary artery disease)   Hyperlipidemia   Tobacco abuse   Status post coronary artery stent placement   Allergies No Known Allergies   History of Present Illness     Tiffany Wright is a 71 y.o. female with a history of HLD, tobacco abuse and no prior cardiac disease who presented to [facility] on 11/17/16 with inferior STEMI.   She had onset of chest pain about 1 hour prior to arrival to [facility]. She called EMS immediately.  First EKG with no ST changes. Third EKG with 1 mm ST segment elevation. Code STEMI activated by EMS. Pt with ongoing pain upon arrival in [facility] ED via EMS. She was taken to cath cab for emergent coronary angiography.    Hospital Course     Consultants: none  STEMI/CAD:  Tiffany Wright had an inferior STEMI that was successfully treated with 1 drug-eluting stent in the RCA. She is doing well this a.m.  She does report some mildly labored breathing. This may be related to her ticagrelor. She is not volume overloaded and her LVEF was greater than 65%. 2D ECHO confirmed preserved LVEF with no WMAs. Continue DAPT for 1 year. Continue metoprolol and atorvastatin.  Tobacco abuse:  Encourage smoking cessation. Nicotine patch in place. Will discharge with nicotine patch.    Hyperlipidemia: LDL 129.  Started atorvastatin 80 mg daily.  She will need lipids and a CMP checked in 6 weeks.   The patient has had an uncomplicated hospital course and is recovering well. The radial catheter site is stable. She has been seen by Dr. Oval Linsey today and deemed ready for discharge home. All follow-up appointments have  been scheduled. Smoking cessation was disscussed in length. A written RX for a 30 day free supply of Brilinta was provided for the patient. A work excuse note was provided as well. Discharge medications are listed below.  _____________  Discharge Vitals Blood pressure 134/62, pulse 71, temperature 98 F (36.7 C), temperature source Oral, resp. rate 18, height 5' (1.524 m), weight 129 lb 3 oz (58.6 kg), SpO2 100 %.  Filed Weights   11/18/16 0000  Weight: 129 lb 3 oz (58.6 kg)   JSH:FWYO-VZCHYIFOY.  No acute distress.   Neck:No JVD Cardiac:RRR, no murmurs, rubs, or gallops.  Respiratory: mild expiratory wheezes.  DX:AJOI, nontender, non-distended  MS:No edema; No deformity. Neuro:Nonfocal  Psych: Normal affect  Ext: Right groin without bruit or hematoma. Left wrist without ecchymoses or hematoma.   Labs & Radiologic Studies     CBC  Recent Labs  11/18/16 0940 11/18/16 1635  WBC 11.1* 12.2*  HGB 13.2 15.0  HCT 41.0 45.7  MCV 90.9 91.6  PLT 244 786   Basic Metabolic Panel  Recent Labs  11/18/16 0940 11/19/16 0531  NA 140 139  K 3.6 4.1  CL 108 108  CO2 23 21*  GLUCOSE 105* 96  BUN 11 9  CREATININE 0.53 0.52  CALCIUM 9.8 9.6   Liver Function Tests  Recent Labs  11/17/16 2254  AST 23  ALT 16  ALKPHOS 70  BILITOT 0.5  PROT 5.2*  ALBUMIN 3.3*  No results for input(s): LIPASE, AMYLASE in the last 72 hours. Cardiac Enzymes  Recent Labs  11/17/16 2254 11/18/16 0940 11/18/16 1635  TROPONINI 0.44* 1.38* 1.27*   BNP Invalid input(s): POCBNP D-Dimer No results for input(s): DDIMER in the last 72 hours. Hemoglobin A1C No results for input(s): HGBA1C in the last 72 hours. Fasting Lipid Panel  Recent Labs  11/18/16 0030  CHOL 190  HDL 40*  LDLCALC 129*  TRIG 106  CHOLHDL 4.8   Thyroid Function Tests No results for input(s): TSH, T4TOTAL, T3FREE, THYROIDAB in the last 72 hours.  Invalid input(s): FREET3  No results  found.   Diagnostic Studies/Procedures    2D ECHO: 11/18/2016 LV EF: 55% -   60% Study Conclusions - Left ventricle: The cavity size was normal. Systolic function was   normal. The estimated ejection fraction was in the range of 55%   to 60%. Wall motion was normal; there were no regional wall   motion abnormalities. - Aortic valve: There was mild regurgitation. - Atrial septum: No defect or patent foramen ovale was identified.  _____________   11/17/16 Coronary Stent Intervention  Left Heart Cath and Coronary Angiography  Conclusion    Mid Cx lesion, 50 %stenosed.  Ost Cx to Prox Cx lesion, 20 %stenosed.  Prox Cx to Mid Cx lesion, 50 %stenosed.  Prox LAD to Mid LAD lesion, 50 %stenosed.  Ost 1st Diag to 1st Diag lesion, 40 %stenosed.  Dist LAD lesion, 20 %stenosed.  Mid RCA lesion, 60 %stenosed.  A STENT RESOLUTE ONYX 4.5X18 drug eluting stent was successfully placed.  Prox RCA lesion, 99 %stenosed.  Post intervention, there is a 0% residual stenosis.  The left ventricular systolic function is normal.  LV end diastolic pressure is normal.  The left ventricular ejection fraction is greater than 65% by visual estimate.  There is no mitral valve regurgitation.   1. Acute inferior STEMI secondary to severe stenosis proximal RCA 2. Successful PTCA/DES x 1 proximal RCA 3. Moderate stenosis mid LAD 4. Moderate stenosis distal Circumflex 5. Moderate stenosis mid RCA 6. Normal LV systolic function  Recommendations: Will plan one year of DAPT with ASA/Brilinta. Will start high dose statin. Will start beta blocker. Cycle troponin. Echo tomorrow. Fast track out of ICU tomorrow if stable. Plan for medical management of moderate residual disease in the LAD, Circumflex and RCA. Smoking cessation.       Disposition   Pt is being discharged home today in good condition.  Follow-up Plans & Appointments    Follow-up Information    Eileen Stanford, PA-C. Go  on 12/01/2016.   Specialties:  Cardiology, Radiology Why:  @ 3pm. please arrive at least 10 minutes early.  Contact information: [address] [place] [address] [phone]          Discharge Instructions    Amb Referral to Cardiac Rehabilitation    Complete by:  As directed    Diagnosis:   Coronary Stents Comment - to [place] STEMI        Discharge Medications     Medication List    STOP taking these medications   simvastatin 40 MG tablet Commonly known as:  ZOCOR     TAKE these medications   aspirin 81 MG chewable tablet Chew 1 tablet (81 mg total) by mouth daily. Start taking on:  11/20/2016   atorvastatin 80 MG tablet Commonly known as:  LIPITOR Take 1 tablet (80 mg total) by mouth daily at 6  PM.   fexofenadine 180 MG tablet Commonly known as:  ALLEGRA Take 90 mg by mouth daily as needed for allergies or rhinitis.   metoprolol tartrate 25 MG tablet Commonly known as:  LOPRESSOR Take 1 tablet (25 mg total) by mouth 2 (two) times daily.   nicotine 21 mg/24hr patch Commonly known as:  NICODERM CQ - dosed in mg/24 hours Place 1 patch (21 mg total) onto the skin daily. Start taking on:  11/20/2016   ticagrelor 90 MG Tabs tablet Commonly known as:  BRILINTA Take 1 tablet (90 mg total) by mouth 2 (two) times daily.   Vitamin D3 5000 units Tabs Take 5,000 Units by mouth at bedtime.       Aspirin prescribed at discharge?  Yes High Intensity Statin Prescribed? (Lipitor 40-80mg  or Crestor 20-40mg ): Yes Beta Blocker Prescribed? Yes For EF 45% or less, Was ACEI/ARB Prescribed? No: EF nl ADP Receptor Inhibitor Prescribed? (i.e. Plavix etc.-Includes Medically Managed Patients): Yes For EF <45%, Aldosterone Inhibitor Prescribed? No: EF nl Was EF assessed during THIS hospitalization? Yes Was Cardiac Rehab II ordered? (Included Medically managed Patients): Yes   Outstanding Labs/Studies   Repeat lipids/ LFTs  Duration of Discharge  Encounter   Greater than 30 minutes including physician time.  Signed, Angelena Form PA-C 11/19/2016, 10:21 AM

## 2016-11-19 NOTE — Discharge Instructions (Signed)

## 2016-11-19 NOTE — Care Management Note (Addendum)
Case Management Note  Patient Details  Name: Tiffany Wright MRN: [id number] Date of Birth: 1946-05-09  Subjective/Objective:       Pt admitted with STEMI             Action/Plan:   PTA independent from home with son.  Pt has PCP and showed CM active [employer] and Medicare Coverage.   Pt will discharge home on Brilinta - CM provided free 30card for Brilinta to pt - CM unable to perform benefit check due to weekend.  CM spoke with NP for Heart clinic; NP will assess copay hardship at follow up appt within the next 2 weeks.  Pt states she will fill prescription at [facility] in [place] on [address] confirmed that pharmacy can fill prescription today   Expected Discharge Date:                  Expected Discharge Plan:  Home/Self Care  In-House Referral:     Discharge planning Services  CM Consult, Medication Assistance  Post Acute Care Choice:    Choice offered to:     DME Arranged:    DME Agency:     HH Arranged:    HH Agency:     Status of Service:     If discussed at [employer] of Stay Meetings, dates discussed:    Additional Comments:  Maryclare Labrador, RN 11/19/2016, 9:59 AM

## 2016-11-20 ENCOUNTER — Encounter (HOSPITAL_COMMUNITY): Payer: Self-pay | Admitting: Cardiovascular Disease

## 2016-11-20 ENCOUNTER — Telehealth: Payer: Self-pay

## 2016-11-20 ENCOUNTER — Telehealth: Payer: Self-pay | Admitting: Cardiovascular Disease

## 2016-11-20 MED FILL — Heparin Sodium (Porcine) Inj 1000 Unit/ML: INTRAMUSCULAR | Qty: 10 | Status: AC

## 2016-11-20 MED FILL — Verapamil HCl IV Soln 2.5 MG/ML: INTRAVENOUS | Qty: 2 | Status: AC

## 2016-11-20 NOTE — Telephone Encounter (Signed)
I spoke with pt who reports she is feeling fine.  She forgot letter for work when she was discharged from hospital.  Will send letter in [facility] dated 11/19/16 to fax number below.  She is aware of follow up appointment with Angelena Form on 12/01/16 and will discuss return to work date with provider at this appt.

## 2016-11-20 NOTE — Telephone Encounter (Signed)
Patient contacted regarding discharge from [facility] on 11/19/2016 Patient understands to follow up with provider Bonney Leitz 12/01/2016 @ 1500 at [facility] office. Patient understands discharge instructions? yes Patient understands medications and regiment? yes Patient understands to bring all medications to this visit? yes  Pt states she is doing good, states she feels stronger than she has for awhile.  Pt states she had some shortness of breath when first starting Brilinta, but states she didn't have SOB today.  Pt states she has some soreness on her right rib cage below her breast.  Pt afraid she had dislodged her stent.  Informed patient that she would not be able to do anything to move the stent.   Verified patient had all her medications prescribed at the hospital.  Pt asked about her medications, gave her information on Lipitor and metoprolol.  Pt asked how would she know if she was having another heart attack.  Told patient that if she started to feel like she did before this episode that she should call our office to be seen because those are her specific s/s to be aware of.  Pt indicates understanding.  Pt states she has been walking in her house.  Pt states she sits down when she feels tired.  Encouraged patient to continue to listen to her body and rest as needed.  Phone number to our office given.  Notified patient to call office if she has any further questions.

## 2016-11-20 NOTE — Telephone Encounter (Signed)
New message   Pt verbalized that she needs a word note from June 23 to July 6 or whenever she is approved to go back to work   Please fax [phone] [facility] [employer]  in [place] Attention to [employer]

## 2016-11-21 LAB — POCT I-STAT, CHEM 8
BUN: 18 mg/dL (ref 6–20)
CALCIUM ION: 1.36 mmol/L (ref 1.15–1.40)
Chloride: 104 mmol/L (ref 101–111)
Creatinine, Ser: 0.5 mg/dL (ref 0.44–1.00)
GLUCOSE: 125 mg/dL — AB (ref 65–99)
HCT: 35 % — ABNORMAL LOW (ref 36.0–46.0)
Hemoglobin: 11.9 g/dL — ABNORMAL LOW (ref 12.0–15.0)
Potassium: 3.4 mmol/L — ABNORMAL LOW (ref 3.5–5.1)
Sodium: 139 mmol/L (ref 135–145)
TCO2: 23 mmol/L (ref 0–100)

## 2016-11-21 LAB — POCT ACTIVATED CLOTTING TIME: Activated Clotting Time: 461 seconds

## 2016-11-30 NOTE — Progress Notes (Signed)
Cardiology Office Note    Date:  12/01/2016   ID:  Rossie Scarfone, DOB 07/10/1945, MRN [id number]  PCP:  Myer Peer, MD  Cardiologist:  Dr. Angelena Form  CC: post hospital follow up   History of Present Illness:  Tiffany Wright is a 71 y.o. female with a history of HLD, tobacco abuse and recently diagnosed CAD; inf STEMI s/p DES to RCA who presents to clinic for post hospital follow up.   She was recently admitted from 6/22-6/24/18 for inferior STEMI that was successfully treated with 1 drug-eluting stent in the RCA. She did report some mildly labored breathing which was felt to possibly be related to her ticagrelor. She was not volume overloaded and her LVEF was greater than 65% by LV gram. 2D ECHO confirmed preserved LVEF with no WMAs.  Today she presents to clinic for follow up. She is doing well. She says sometimes she feels like she "forgets how to breath" or like she is more aware of her breathing. It doesn't bother her too much. She has been walking and also does a lot of house work. She sews surgical hoes for a living. She wants to go back but doesn't want to work more than 40 hours. No CP or SOB. No LE edema, orthopnea or PND. No dizziness or syncope. No blood in stool or urine. No palpitations. No claudication.    Past Medical History:  Diagnosis Date  . CAD (coronary artery disease)    a. 10/2016: inferior STEMI s/p DES to RCA  . Hyperlipidemia   . Tobacco abuse     Past Surgical History:  Procedure Laterality Date  . CHOLECYSTECTOMY    . CORONARY STENT INTERVENTION N/A 11/17/2016   Procedure: Coronary Stent Intervention;  Surgeon: Burnell Blanks, MD;  Location: [facility];  Service: Cardiovascular;  Laterality: N/A;  . LEFT HEART CATH AND CORONARY ANGIOGRAPHY N/A 11/17/2016   Procedure: Left Heart Cath and Coronary Angiography;  Surgeon: Burnell Blanks, MD;  Location: [facility];  Service: Cardiovascular;  Laterality: N/A;    Current  Medications: Outpatient Medications Prior to Visit  Medication Sig Dispense Refill  . aspirin 81 MG chewable tablet Chew 1 tablet (81 mg total) by mouth daily.    Marland Kitchen atorvastatin (LIPITOR) 80 MG tablet Take 1 tablet (80 mg total) by mouth daily at 6 PM. 90 tablet 3  . Cholecalciferol (VITAMIN D3) 5000 units TABS Take 5,000 Units by mouth at bedtime.     . fexofenadine (ALLEGRA) 180 MG tablet Take 90 mg by mouth daily as needed for allergies or rhinitis.    . metoprolol tartrate (LOPRESSOR) 25 MG tablet Take 1 tablet (25 mg total) by mouth 2 (two) times daily. 60 tablet 6  . nicotine (NICODERM CQ - DOSED IN MG/24 HOURS) 21 mg/24hr patch Place 1 patch (21 mg total) onto the skin daily. 28 patch 12  . ticagrelor (BRILINTA) 90 MG TABS tablet Take 1 tablet (90 mg total) by mouth 2 (two) times daily. 180 tablet 4   No facility-administered medications prior to visit.      Allergies:   Patient has no known allergies.   Social History   Social History  . Marital status: Married    Spouse name: N/A  . Number of children: N/A  . Years of education: N/A   Social History Main Topics  . Smoking status: Former Smoker    Packs/day: 1.00    Years: 30.00    Types: Cigarettes  Quit date: 11/09/2016  . Smokeless tobacco: Never Used  . Alcohol use No  . Drug use: No  . Sexual activity: Not Asked   Other Topics Concern  . None   Social History Narrative  . None     Family History:  The patient's family history includes Heart attack in her brother.      ROS:   Please see the history of present illness.    ROS All other systems reviewed and are negative.   PHYSICAL EXAM:   VS:  BP 122/78   Pulse 64   Ht 5' (1.524 m)   Wt 130 lb 1.9 oz (59 kg)   SpO2 99%   BMI 25.41 kg/m    GEN: Well nourished, well developed, in no acute distress  HEENT: normal  Neck: no JVD, carotid bruits, or masses Cardiac: RRR; no murmurs, rubs, or gallops,no edema  Respiratory:  clear to auscultation  bilaterally, normal work of breathing GI: soft, nontender, nondistended, + BS MS: no deformity or atrophy  Skin: warm and dry, no rash Neuro:  Alert and Oriented x 3, Strength and sensation are intact Psych: euthymic mood, full affect   Wt Readings from Last 3 Encounters:  12/01/16 130 lb 1.9 oz (59 kg)  11/18/16 129 lb 3 oz (58.6 kg)    Studies/Labs Reviewed:   EKG:  EKG is ordered today.  The ekg ordered today demonstrates NSR HR 65  Recent Labs: 11/17/2016: ALT 16 11/18/2016: Hemoglobin 15.0; Platelets 248 11/19/2016: BUN 9; Creatinine, Ser 0.52; Potassium 4.1; Sodium 139   Lipid Panel    Component Value Date/Time   CHOL 190 11/18/2016 0030   TRIG 106 11/18/2016 0030   HDL 40 (L) 11/18/2016 0030   CHOLHDL 4.8 11/18/2016 0030   VLDL 21 11/18/2016 0030   LDLCALC 129 (H) 11/18/2016 0030    Additional studies/ records that were reviewed today include:  2D ECHO: 11/18/2016 LV EF: 55% - 60% Study Conclusions - Left ventricle: The cavity size was normal. Systolic function was normal. The estimated ejection fraction was in the range of 55% to 60%. Wall motion was normal; there were no regional wall motion abnormalities. - Aortic valve: There was mild regurgitation. - Atrial septum: No defect or patent foramen ovale was identified.  _____________   11/17/16 Coronary Stent Intervention  Left Heart Cath and Coronary Angiography  Conclusion    Mid Cx lesion, 50 %stenosed.  Ost Cx to Prox Cx lesion, 20 %stenosed.  Prox Cx to Mid Cx lesion, 50 %stenosed.  Prox LAD to Mid LAD lesion, 50 %stenosed.  Ost 1st Diag to 1st Diag lesion, 40 %stenosed.  Dist LAD lesion, 20 %stenosed.  Mid RCA lesion, 60 %stenosed.  A STENT RESOLUTE ONYX 4.5X18 drug eluting stent was successfully placed.  Prox RCA lesion, 99 %stenosed.  Post intervention, there is a 0% residual stenosis.  The left ventricular systolic function is normal.  LV end diastolic pressure is  normal.  The left ventricular ejection fraction is greater than 65% by visual estimate.  There is no mitral valve regurgitation.  1. Acute inferior STEMI secondary to severe stenosis proximal RCA 2. Successful PTCA/DES x 1 proximal RCA 3. Moderate stenosis mid LAD 4. Moderate stenosis distal Circumflex 5. Moderate stenosis mid RCA 6. Normal LV systolic function  Recommendations: Will plan one year of DAPT with ASA/Brilinta. Will start high dose statin. Will start beta blocker. Cycle troponin. Echo tomorrow. Fast track out of ICU tomorrow if stable. Plan for medical management  of moderate residual disease in the LAD, Circumflex and RCA. Smoking cessation.       ASSESSMENT & PLAN:   CAD: stable. Continue ASA/Brilinta, BB and statin. She has some mild dyspnea that she thinks is related to the [place] but it doesn't bother her much. She would like to continue on this for now. If it worsens we can try switching her plavix. She would also like to return to work next week but doesn't want to work >40 hours which I think is reasonable. I have written a note to clear her to return to work    Tobacco abuse: she has totally quit. She is wearing a nicotine patch.   HLD: LDL 129. Goal <70. Recently stated on atorva 80mg  daily.     Medication Adjustments/Labs and Tests Ordered: Current medicines are reviewed at length with the patient today.  Concerns regarding medicines are outlined above.  Medication changes, Labs and Tests ordered today are listed in the Patient Instructions below. Patient Instructions  Medication Instructions:  Your physician recommends that you continue on your current medications as directed. Please refer to the Current Medication list given to you today.   Labwork: None ordered   Testing/Procedures: None ordered   Follow-Up: Your physician wants you to follow-up in: 3 months with Dr. Angelena Form   Any Other Special Instructions Will Be Listed Below (If  Applicable).     If you need a refill on your cardiac medications before your next appointment, please call your pharmacy.      Signed, Angelena Form, PA-C  12/01/2016 [facility] [place], [place], [place]  [address] Phone: [phone]; Fax: [phone]

## 2016-12-01 ENCOUNTER — Encounter: Payer: Self-pay | Admitting: Physician Assistant

## 2016-12-01 ENCOUNTER — Ambulatory Visit (INDEPENDENT_AMBULATORY_CARE_PROVIDER_SITE_OTHER): Payer: 59 | Admitting: Physician Assistant

## 2016-12-01 ENCOUNTER — Other Ambulatory Visit: Payer: Self-pay | Admitting: Physician Assistant

## 2016-12-01 VITALS — BP 122/78 | HR 64 | Ht 60.0 in | Wt 130.1 lb

## 2016-12-01 DIAGNOSIS — E785 Hyperlipidemia, unspecified: Secondary | ICD-10-CM | POA: Diagnosis not present

## 2016-12-01 DIAGNOSIS — Z720 Tobacco use: Secondary | ICD-10-CM

## 2016-12-01 DIAGNOSIS — I2510 Atherosclerotic heart disease of native coronary artery without angina pectoris: Secondary | ICD-10-CM

## 2016-12-01 NOTE — Patient Instructions (Signed)
Medication Instructions:  Your physician recommends that you continue on your current medications as directed. Please refer to the Current Medication list given to you today.   Labwork: None ordered  Testing/Procedures: None ordered  Follow-Up:  Your physician wants you to follow-up in: 3 months with Dr. McAlhany.   Any Other Special Instructions Will Be Listed Below (If Applicable).   If you need a refill on your cardiac medications before your next appointment, please call your pharmacy.   

## 2016-12-15 ENCOUNTER — Telehealth: Payer: Self-pay | Admitting: Physician Assistant

## 2016-12-15 MED ORDER — CLOPIDOGREL BISULFATE 75 MG PO TABS: 75.0000 mg | ORAL_TABLET | Freq: Every day | ORAL | 3 refills | Status: DC

## 2016-12-15 NOTE — Telephone Encounter (Signed)
Reviewed w/ DOD, Dr. Saunders Revel. Advised pt to take Brilinta tonight.   Tomorrow, start Plavix 300 mg once.  Sunday, start 75 mg daily. Patient verbalized understanding and agreeable to plan.

## 2016-12-15 NOTE — Telephone Encounter (Signed)
New message    Pt c/o medication issue:  1. Name of Medication: Brillinta  2. How are you currently taking this medication (dosage and times per day)?  2x a day 1 in am 1 in pm   3. Are you having a reaction (difficulty breathing--STAT)? Difficulty breathing  4. What is your medication issue?  Sob has gotten worse, said she is struggling to breathe

## 2017-02-27 ENCOUNTER — Telehealth: Payer: Self-pay | Admitting: Radiology

## 2017-02-27 ENCOUNTER — Telehealth: Payer: Self-pay | Admitting: Physician Assistant

## 2017-02-27 NOTE — Telephone Encounter (Signed)
Error

## 2017-02-27 NOTE — Telephone Encounter (Signed)
New message    Pt is calling asking for a call back from nurse. She did not say what it was about.

## 2017-02-28 NOTE — Telephone Encounter (Signed)
Did they need me to call her? I don't understand the question

## 2017-03-01 NOTE — Telephone Encounter (Signed)
New message  Pt verbalized that she is returning call for the rn   To see if she can return to work full time

## 2017-03-01 NOTE — Telephone Encounter (Signed)
Returned call to pt and left a message for pt to call back.  

## 2017-03-02 NOTE — Telephone Encounter (Signed)
Returned pts call and advised her that she can return to work at no restrictions now.  She is scheduled to see Dr. Angelena Form 03/12/17 and states that she will get Dr. Angelena Form to write it at that time.  Pt thanked me for calling her back.

## 2017-03-02 NOTE — Telephone Encounter (Signed)
Follow up     Pt is returning [place] call from earlier

## 2017-03-02 NOTE — Telephone Encounter (Signed)
Returned pts call re: returning to work full time, and left her a message to call back.

## 2017-03-02 NOTE — Telephone Encounter (Signed)
That is totally fine.

## 2017-03-12 ENCOUNTER — Encounter: Payer: Self-pay | Admitting: Cardiovascular Disease

## 2017-03-12 ENCOUNTER — Ambulatory Visit (INDEPENDENT_AMBULATORY_CARE_PROVIDER_SITE_OTHER): Payer: 59 | Admitting: Cardiovascular Disease

## 2017-03-12 VITALS — BP 126/80 | HR 69 | Ht 60.0 in | Wt 133.0 lb

## 2017-03-12 DIAGNOSIS — I2510 Atherosclerotic heart disease of native coronary artery without angina pectoris: Secondary | ICD-10-CM | POA: Diagnosis not present

## 2017-03-12 MED ORDER — NITROGLYCERIN 0.4 MG SL SUBL: 0.4000 mg | SUBLINGUAL_TABLET | SUBLINGUAL | 6 refills | Status: DC | PRN

## 2017-03-12 NOTE — Patient Instructions (Signed)

## 2017-03-12 NOTE — Progress Notes (Signed)
Chief Complaint  Patient presents with  . Follow-up    CAD   History of Present Illness: 71 yo female with history of CAD, HLD and tobacco abuse here today for cardiac follow up. She was admitted to [facility] 11/17/16 with an inferior STEMI. Cardiac cath with occluded RCA treated with a drug eluting stent. Residual moderate non-obstructive disease in the mid RCA, mid LAD and mid Circumflex. Echo June 2018 post MI with normal LV systolic function. She was switched to Plavix in July 2018 due to dyspnea with Brilinta.    She is here today for follow up. The patient denies any chest pain, dyspnea, palpitations, lower extremity edema, orthopnea, PND, dizziness, near syncope or syncope. She is back at work. She stopped smoking.   Primary Care Physician: Myer Peer, MD  Past Medical History:  Diagnosis Date  . CAD (coronary artery disease)    a. 10/2016: inferior STEMI s/p DES to RCA  . Hyperlipidemia   . Tobacco abuse     Past Surgical History:  Procedure Laterality Date  . CHOLECYSTECTOMY    . CORONARY STENT INTERVENTION N/A 11/17/2016   Procedure: Coronary Stent Intervention;  Surgeon: Burnell Blanks, MD;  Location: [facility];  Service: Cardiovascular;  Laterality: N/A;  . LEFT HEART CATH AND CORONARY ANGIOGRAPHY N/A 11/17/2016   Procedure: Left Heart Cath and Coronary Angiography;  Surgeon: Burnell Blanks, MD;  Location: [facility];  Service: Cardiovascular;  Laterality: N/A;    Current Outpatient Prescriptions  Medication Sig Dispense Refill  . aspirin 81 MG chewable tablet Chew 1 tablet (81 mg total) by mouth daily.    Marland Kitchen atorvastatin (LIPITOR) 80 MG tablet Take 1 tablet (80 mg total) by mouth daily at 6 PM. 90 tablet 3  . Cholecalciferol (VITAMIN D3) 5000 units TABS Take 5,000 Units by mouth at bedtime.     . clopidogrel (PLAVIX) 75 MG tablet Take 1 tablet (75 mg total) by mouth daily. 90 tablet 3  . fexofenadine (ALLEGRA) 180 MG tablet Take 90 mg  by mouth daily as needed for allergies or rhinitis.    . metoprolol tartrate (LOPRESSOR) 25 MG tablet Take 1 tablet (25 mg total) by mouth 2 (two) times daily. 60 tablet 6  . mometasone (NASONEX) 50 MCG/ACT nasal spray Place 2 sprays into the nose daily as needed (allergies).    . Multiple Vitamins-Minerals (CENTRUM SILVER PO) Take 1 tablet by mouth daily.    . nitroGLYCERIN (NITROSTAT) 0.4 MG SL tablet Place 1 tablet (0.4 mg total) under the tongue every 5 (five) minutes as needed for chest pain. 25 tablet 6   No current facility-administered medications for this visit.     No Known Allergies  Social History   Social History  . Marital status: Married    Spouse name: N/A  . Number of children: N/A  . Years of education: N/A   Occupational History  . Not on file.   Social History Main Topics  . Smoking status: Former Smoker    Packs/day: 1.00    Years: 30.00    Types: Cigarettes    Quit date: 11/09/2016  . Smokeless tobacco: Never Used  . Alcohol use No  . Drug use: No  . Sexual activity: Not on file   Other Topics Concern  . Not on file   Social History Narrative  . No narrative on file    Family History  Problem Relation Age of Onset  . Heart attack Brother  Review of Systems:  As stated in the HPI and otherwise negative.   BP 126/80   Pulse 69   Ht 5' (1.524 m)   Wt 133 lb (60.3 kg)   SpO2 99%   BMI 25.97 kg/m   Physical Examination: General: Well developed, well nourished, NAD  HEENT: OP clear, mucus membranes moist  SKIN: warm, dry. No rashes. Neuro: No focal deficits  Musculoskeletal: Muscle strength 5/5 all ext  Psychiatric: Mood and affect normal  Neck: No JVD, no carotid bruits, no thyromegaly, no lymphadenopathy.  Lungs:Clear bilaterally, no wheezes, rhonci, crackles Cardiovascular: Regular rate and rhythm. No murmurs, gallops or rubs. Abdomen:Soft. Bowel sounds present. Non-tender.  Extremities: No lower extremity edema. Pulses are 2 + in  the bilateral DP/PT.  Echo 11/18/16: - Left ventricle: The cavity size was normal. Systolic function was   normal. The estimated ejection fraction was in the range of 55%   to 60%. Wall motion was normal; there were no regional wall   motion abnormalities. - Aortic valve: There was mild regurgitation. - Atrial septum: No defect or patent foramen ovale was identified.  Cardiac cath 11/17/16:  Mid Cx lesion, 50 %stenosed.  Ost Cx to Prox Cx lesion, 20 %stenosed.  Prox Cx to Mid Cx lesion, 50 %stenosed.  Prox LAD to Mid LAD lesion, 50 %stenosed.  Ost 1st Diag to 1st Diag lesion, 40 %stenosed.  Dist LAD lesion, 20 %stenosed.  Mid RCA lesion, 60 %stenosed.  A STENT RESOLUTE ONYX 4.5X18 drug eluting stent was successfully placed.  Prox RCA lesion, 99 %stenosed.  Post intervention, there is a 0% residual stenosis.  The left ventricular systolic function is normal.  LV end diastolic pressure is normal.  The left ventricular ejection fraction is greater than 65% by visual estimate.  There is no mitral valve regurgitation.   1. Acute inferior STEMI secondary to severe stenosis proximal RCA 2. Successful PTCA/DES x 1 proximal RCA 3. Moderate stenosis mid LAD 4. Moderate stenosis distal Circumflex 5. Moderate stenosis mid RCA 6. Normal LV systolic function  Diagnostic Diagram       Post-Intervention Diagram       Implants     Hemo Data    Most Recent Value  AO Systolic Pressure 13 mmHg  AO Diastolic Pressure 69 mmHg  AO Mean 98 mmHg  LV Systolic Pressure 834 mmHg  LV Diastolic Pressure 9 mmHg  LV EDP 17 mmHg  Arterial Occlusion Pressure Extended Systolic Pressure 196 mmHg  Arterial Occlusion Pressure Extended Diastolic Pressure 77 mmHg  Arterial Occlusion Pressure Extended Mean Pressure 108 mmHg  Left Ventricular Apex Extended Systolic Pressure 222 mmHg  Left Ventricular Apex Extended Diastolic Pressure 10 mmHg  Left Ventricular Apex Extended EDP Pressure 24  mmHg  Order-Level Documents:   There are no order-level documents.  Encounter-Level Documents - 11/17/2016:   Scan on 11/20/2016 10:48 AM by Default, Provider, MDScan on 11/20/2016 10:48 AM by Default, Provider, MD  Document on 11/19/2016 10:43 AM by Fritz Pickerel, RN : IP After Visit Summary  Scan on 11/18/2016 9:16 PM by Default, Provider, MDScan on 11/18/2016 9:16 PM by Default, Provider, MD  Scan on 11/19/2016 9:04 AM by Default, Provider, MDScan on 11/19/2016 9:04 AM by Default, Provider, MD  Scan on 11/17/2016 11:33 PM by Default, Provider, MDScan on 11/17/2016 11:33 PM by Default, Provider, MD      Signed   Electronically signed by Burnell Blanks, MD on 11/17/16 at 40 EDT  External Result Report  External Result Report     EKG:  EKG is not ordered today. The ekg ordered today demonstrates   Recent Labs: 11/17/2016: ALT 16 11/18/2016: Hemoglobin 15.0; Platelets 248 11/19/2016: BUN 9; Creatinine, Ser 0.52; Potassium 4.1; Sodium 139   Lipid Panel    Component Value Date/Time   CHOL 190 11/18/2016 0030   TRIG 106 11/18/2016 0030   HDL 40 (L) 11/18/2016 0030   CHOLHDL 4.8 11/18/2016 0030   VLDL 21 11/18/2016 0030   LDLCALC 129 (H) 11/18/2016 0030     Wt Readings from Last 3 Encounters:  03/12/17 133 lb (60.3 kg)  12/01/16 130 lb 1.9 oz (59 kg)  11/18/16 129 lb 3 oz (58.6 kg)     Other studies Reviewed: Additional studies/ records that were reviewed today include: . Review of the above records demonstrates:    Assessment and Plan:   1. CAD without angina: Inferior MI in June 2018. She is doing well with no chest pain suggestive of angina. LV function normal before discharge. She is now on ASA/Plavix, beta blocker and statin. No changes. Will give her prescription for SL NTG to use prn   2. Tobacco abuse, in remission: She has stopped smoking.   Current medicines are reviewed at length with the patient today.  The patient does not have concerns  regarding medicines.  The following changes have been made:  no change  Labs/ tests ordered today include:  No orders of the defined types were placed in this encounter.    Disposition:   FU with me in 6 months   Signed, Lauree Chandler, MD 03/12/2017 4:17 PM    [facility] [place], [place], [place]  [address] Phone: [phone]; Fax: [phone]

## 2017-05-15 DIAGNOSIS — Z6823 Body mass index (BMI) 23.0-23.9, adult: Secondary | ICD-10-CM | POA: Diagnosis not present

## 2017-05-15 DIAGNOSIS — R040 Epistaxis: Secondary | ICD-10-CM | POA: Diagnosis not present

## 2017-05-15 DIAGNOSIS — Z7901 Long term (current) use of anticoagulants: Secondary | ICD-10-CM | POA: Diagnosis not present

## 2017-05-15 DIAGNOSIS — F17200 Nicotine dependence, unspecified, uncomplicated: Secondary | ICD-10-CM | POA: Diagnosis not present

## 2017-05-30 DIAGNOSIS — J019 Acute sinusitis, unspecified: Secondary | ICD-10-CM | POA: Diagnosis not present

## 2017-05-30 DIAGNOSIS — F17201 Nicotine dependence, unspecified, in remission: Secondary | ICD-10-CM | POA: Diagnosis not present

## 2017-05-30 DIAGNOSIS — F4321 Adjustment disorder with depressed mood: Secondary | ICD-10-CM | POA: Diagnosis not present

## 2017-05-30 DIAGNOSIS — R040 Epistaxis: Secondary | ICD-10-CM | POA: Diagnosis not present

## 2017-06-14 DIAGNOSIS — Z79899 Other long term (current) drug therapy: Secondary | ICD-10-CM | POA: Diagnosis not present

## 2017-06-14 DIAGNOSIS — I2510 Atherosclerotic heart disease of native coronary artery without angina pectoris: Secondary | ICD-10-CM | POA: Diagnosis not present

## 2017-06-14 DIAGNOSIS — Z13220 Encounter for screening for lipoid disorders: Secondary | ICD-10-CM | POA: Diagnosis not present

## 2017-06-14 DIAGNOSIS — Z0000 Encounter for general adult medical examination without abnormal findings: Secondary | ICD-10-CM | POA: Diagnosis not present

## 2017-06-14 DIAGNOSIS — Z1331 Encounter for screening for depression: Secondary | ICD-10-CM | POA: Diagnosis not present

## 2017-06-18 DIAGNOSIS — J0100 Acute maxillary sinusitis, unspecified: Secondary | ICD-10-CM | POA: Diagnosis not present

## 2017-07-04 DIAGNOSIS — F4321 Adjustment disorder with depressed mood: Secondary | ICD-10-CM | POA: Diagnosis not present

## 2017-07-30 DIAGNOSIS — J324 Chronic pansinusitis: Secondary | ICD-10-CM | POA: Diagnosis not present

## 2017-08-01 DIAGNOSIS — Z6823 Body mass index (BMI) 23.0-23.9, adult: Secondary | ICD-10-CM | POA: Diagnosis not present

## 2017-08-01 DIAGNOSIS — F4321 Adjustment disorder with depressed mood: Secondary | ICD-10-CM | POA: Diagnosis not present

## 2017-08-01 DIAGNOSIS — L304 Erythema intertrigo: Secondary | ICD-10-CM | POA: Diagnosis not present

## 2017-08-09 DIAGNOSIS — J209 Acute bronchitis, unspecified: Secondary | ICD-10-CM | POA: Diagnosis not present

## 2017-08-09 DIAGNOSIS — H6591 Unspecified nonsuppurative otitis media, right ear: Secondary | ICD-10-CM | POA: Diagnosis not present

## 2017-08-09 DIAGNOSIS — Z6823 Body mass index (BMI) 23.0-23.9, adult: Secondary | ICD-10-CM | POA: Diagnosis not present

## 2017-09-06 DIAGNOSIS — J302 Other seasonal allergic rhinitis: Secondary | ICD-10-CM | POA: Diagnosis not present

## 2017-09-06 DIAGNOSIS — Z6824 Body mass index (BMI) 24.0-24.9, adult: Secondary | ICD-10-CM | POA: Diagnosis not present

## 2017-09-06 DIAGNOSIS — F4321 Adjustment disorder with depressed mood: Secondary | ICD-10-CM | POA: Diagnosis not present

## 2017-09-16 NOTE — Progress Notes (Deleted)
No chief complaint on file.  History of Present Illness: 72 yo female with history of CAD, HLD and tobacco abuse here today for cardiac follow up. She was admitted to [facility] 11/17/16 with an inferior STEMI. Cardiac cath with occluded RCA treated with a drug eluting stent. Residual moderate non-obstructive disease in the mid RCA, mid LAD and mid Circumflex. Echo June 2018 post MI with normal LV systolic function. She was switched to Plavix in July 2018 due to dyspnea with Brilinta.    She is here today for follow up. The patient denies any chest pain, dyspnea, palpitations, lower extremity edema, orthopnea, PND, dizziness, near syncope or syncope.   Primary Care Physician: Myer Peer, MD  Past Medical History:  Diagnosis Date  . CAD (coronary artery disease)    a. 10/2016: inferior STEMI s/p DES to RCA  . Hyperlipidemia   . Tobacco abuse     Past Surgical History:  Procedure Laterality Date  . CHOLECYSTECTOMY    . CORONARY STENT INTERVENTION N/A 11/17/2016   Procedure: Coronary Stent Intervention;  Surgeon: Burnell Blanks, MD;  Location: [facility];  Service: Cardiovascular;  Laterality: N/A;  . LEFT HEART CATH AND CORONARY ANGIOGRAPHY N/A 11/17/2016   Procedure: Left Heart Cath and Coronary Angiography;  Surgeon: Burnell Blanks, MD;  Location: [facility];  Service: Cardiovascular;  Laterality: N/A;    Current Outpatient Medications  Medication Sig Dispense Refill  . aspirin 81 MG chewable tablet Chew 1 tablet (81 mg total) by mouth daily.    Marland Kitchen atorvastatin (LIPITOR) 80 MG tablet Take 1 tablet (80 mg total) by mouth daily at 6 PM. 90 tablet 3  . Cholecalciferol (VITAMIN D3) 5000 units TABS Take 5,000 Units by mouth at bedtime.     . clopidogrel (PLAVIX) 75 MG tablet Take 1 tablet (75 mg total) by mouth daily. 90 tablet 3  . fexofenadine (ALLEGRA) 180 MG tablet Take 90 mg by mouth daily as needed for allergies or rhinitis.    . metoprolol tartrate  (LOPRESSOR) 25 MG tablet Take 1 tablet (25 mg total) by mouth 2 (two) times daily. 60 tablet 6  . mometasone (NASONEX) 50 MCG/ACT nasal spray Place 2 sprays into the nose daily as needed (allergies).    . Multiple Vitamins-Minerals (CENTRUM SILVER PO) Take 1 tablet by mouth daily.    . nitroGLYCERIN (NITROSTAT) 0.4 MG SL tablet Place 1 tablet (0.4 mg total) under the tongue every 5 (five) minutes as needed for chest pain. 25 tablet 6   No current facility-administered medications for this visit.     No Known Allergies  Social History   Socioeconomic History  . Marital status: Married    Spouse name: Not on file  . Number of children: Not on file  . Years of education: Not on file  . Highest education level: Not on file  Occupational History  . Not on file  Social Needs  . Financial resource strain: Not on file  . Food insecurity:    Worry: Not on file    Inability: Not on file  . Transportation needs:    Medical: Not on file    Non-medical: Not on file  Tobacco Use  . Smoking status: Former Smoker    Packs/day: 1.00    Years: 30.00    Pack years: 30.00    Types: Cigarettes    Last attempt to quit: 11/09/2016    Years since quitting: 0.8  . Smokeless tobacco: Never  Used  Substance and Sexual Activity  . Alcohol use: No  . Drug use: No  . Sexual activity: Not on file  Lifestyle  . Physical activity:    Days per week: Not on file    Minutes per session: Not on file  . Stress: Not on file  Relationships  . Social connections:    Talks on phone: Not on file    Gets together: Not on file    Attends religious service: Not on file    Active member of club or organization: Not on file    Attends meetings of clubs or organizations: Not on file    Relationship status: Not on file  . Intimate partner violence:    Fear of current or ex partner: Not on file    Emotionally abused: Not on file    Physically abused: Not on file    Forced sexual activity: Not on file  Other  Topics Concern  . Not on file  Social History Narrative  . Not on file    Family History  Problem Relation Age of Onset  . Heart attack Brother     Review of Systems:  As stated in the HPI and otherwise negative.   There were no vitals taken for this visit.  Physical Examination:  General: Well developed, well nourished, NAD  HEENT: OP clear, mucus membranes moist  SKIN: warm, dry. No rashes. Neuro: No focal deficits  Musculoskeletal: Muscle strength 5/5 all ext  Psychiatric: Mood and affect normal  Neck: No JVD, no carotid bruits, no thyromegaly, no lymphadenopathy.  Lungs:Clear bilaterally, no wheezes, rhonci, crackles Cardiovascular: Regular rate and rhythm. No murmurs, gallops or rubs. Abdomen:Soft. Bowel sounds present. Non-tender.  Extremities: No lower extremity edema. Pulses are 2 + in the bilateral DP/PT.  Echo 11/18/16: - Left ventricle: The cavity size was normal. Systolic function was   normal. The estimated ejection fraction was in the range of 55%   to 60%. Wall motion was normal; there were no regional wall   motion abnormalities. - Aortic valve: There was mild regurgitation. - Atrial septum: No defect or patent foramen ovale was identified.  Cardiac cath 11/17/16:  Mid Cx lesion, 50 %stenosed.  Ost Cx to Prox Cx lesion, 20 %stenosed.  Prox Cx to Mid Cx lesion, 50 %stenosed.  Prox LAD to Mid LAD lesion, 50 %stenosed.  Ost 1st Diag to 1st Diag lesion, 40 %stenosed.  Dist LAD lesion, 20 %stenosed.  Mid RCA lesion, 60 %stenosed.  A STENT RESOLUTE ONYX 4.5X18 drug eluting stent was successfully placed.  Prox RCA lesion, 99 %stenosed.  Post intervention, there is a 0% residual stenosis.  The left ventricular systolic function is normal.  LV end diastolic pressure is normal.  The left ventricular ejection fraction is greater than 65% by visual estimate.  There is no mitral valve regurgitation.   1. Acute inferior STEMI secondary to severe  stenosis proximal RCA 2. Successful PTCA/DES x 1 proximal RCA 3. Moderate stenosis mid LAD 4. Moderate stenosis distal Circumflex 5. Moderate stenosis mid RCA 6. Normal LV systolic function  Diagnostic Diagram       Post-Intervention Diagram       Implants     Hemo Data    Most Recent Value  AO Systolic Pressure 13 mmHg  AO Diastolic Pressure 69 mmHg  AO Mean 98 mmHg  LV Systolic Pressure 154 mmHg  LV Diastolic Pressure 9 mmHg  LV EDP 17 mmHg  Arterial Occlusion Pressure Extended Systolic  Pressure 149 mmHg  Arterial Occlusion Pressure Extended Diastolic Pressure 77 mmHg  Arterial Occlusion Pressure Extended Mean Pressure 108 mmHg  Left Ventricular Apex Extended Systolic Pressure 354 mmHg  Left Ventricular Apex Extended Diastolic Pressure 10 mmHg  Left Ventricular Apex Extended EDP Pressure 24 mmHg  Order-Level Documents:   EKG:  EKG is not *** ordered today. The ekg ordered today demonstrates   Recent Labs: 11/17/2016: ALT 16 11/18/2016: Hemoglobin 15.0; Platelets 248 11/19/2016: BUN 9; Creatinine, Ser 0.52; Potassium 4.1; Sodium 139   Lipid Panel    Component Value Date/Time   CHOL 190 11/18/2016 0030   TRIG 106 11/18/2016 0030   HDL 40 (L) 11/18/2016 0030   CHOLHDL 4.8 11/18/2016 0030   VLDL 21 11/18/2016 0030   LDLCALC 129 (H) 11/18/2016 0030     Wt Readings from Last 3 Encounters:  03/12/17 133 lb (60.3 kg)  12/01/16 130 lb 1.9 oz (59 kg)  11/18/16 129 lb 3 oz (58.6 kg)     Other studies Reviewed: Additional studies/ records that were reviewed today include: . Review of the above records demonstrates:    Assessment and Plan:   1. CAD without angina: No chest pain. Will continue ASA, Plavix, statin and beta blocker.   2. Tobacco abuse, in remission: She has not started back smoking.   Current medicines are reviewed at length with the patient today.  The patient does not have concerns regarding medicines.  The following changes have been made:   no change  Labs/ tests ordered today include:  No orders of the defined types were placed in this encounter.    Disposition:   FU with me in 6 months   Signed, Lauree Chandler, MD 09/16/2017 7:53 PM    [address], [place], [place]  [address] Phone: [phone]; Fax: [phone]

## 2017-09-17 ENCOUNTER — Ambulatory Visit: Payer: 59 | Admitting: Cardiovascular Disease

## 2017-10-04 DIAGNOSIS — F4321 Adjustment disorder with depressed mood: Secondary | ICD-10-CM | POA: Diagnosis not present

## 2017-10-04 DIAGNOSIS — Z6824 Body mass index (BMI) 24.0-24.9, adult: Secondary | ICD-10-CM | POA: Diagnosis not present

## 2017-10-16 ENCOUNTER — Ambulatory Visit (INDEPENDENT_AMBULATORY_CARE_PROVIDER_SITE_OTHER): Payer: 59 | Admitting: Physician Assistant

## 2017-10-16 ENCOUNTER — Encounter: Payer: Self-pay | Admitting: Physician Assistant

## 2017-10-16 ENCOUNTER — Encounter: Payer: Self-pay | Admitting: *Deleted

## 2017-10-16 ENCOUNTER — Encounter (INDEPENDENT_AMBULATORY_CARE_PROVIDER_SITE_OTHER): Payer: Self-pay

## 2017-10-16 VITALS — BP 132/78 | HR 68 | Ht 60.0 in | Wt 134.8 lb

## 2017-10-16 DIAGNOSIS — R0789 Other chest pain: Secondary | ICD-10-CM | POA: Diagnosis not present

## 2017-10-16 DIAGNOSIS — E782 Mixed hyperlipidemia: Secondary | ICD-10-CM | POA: Diagnosis not present

## 2017-10-16 DIAGNOSIS — Z720 Tobacco use: Secondary | ICD-10-CM

## 2017-10-16 DIAGNOSIS — I2510 Atherosclerotic heart disease of native coronary artery without angina pectoris: Secondary | ICD-10-CM | POA: Diagnosis not present

## 2017-10-16 NOTE — Progress Notes (Signed)
Cardiology Office Note    Date:  10/16/2017   ID:  Tiffany Wright, DOB Jul 04, 1945, MRN [id number]  PCP:  Myer Peer, MD  Cardiologist: Lauree Chandler, MD  Chief Complaint  Patient presents with  . Follow-up    History of Present Illness:  Tiffany Wright is a 72 y.o. female with CAD status post inferior STEMI 11/17/2016 treated with DES to the RCA, residual moderate nonobstructive disease in the mid RCA mid LAD and mid circumflex.  Follow-up echo 10/2016 normal LV function.  Patient was switched from Brilinta to Plavix for shortness of breath.  Also has HLD and tobacco abuse.  Patient comes in f/u. Getting ready to retire. 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 Friday after work she could feel her heart skipping every few beats and took a NTG and things calmed down. Has used about 20 NTG since her MI. Not sure if she takes them for the right reason.  Denies any chest tightness or pressure.  Only symptom is palpitations and she thinks the Wellbutrin causes.  Past Medical History:  Diagnosis Date  . CAD (coronary artery disease)    a. 10/2016: inferior STEMI s/p DES to RCA  . Hyperlipidemia   . Tobacco abuse     Past Surgical History:  Procedure Laterality Date  . CHOLECYSTECTOMY    . CORONARY STENT INTERVENTION N/A 11/17/2016   Procedure: Coronary Stent Intervention;  Surgeon: Burnell Blanks, MD;  Location: [facility];  Service: Cardiovascular;  Laterality: N/A;  . LEFT HEART CATH AND CORONARY ANGIOGRAPHY N/A 11/17/2016   Procedure: Left Heart Cath and Coronary Angiography;  Surgeon: Burnell Blanks, MD;  Location: [facility];  Service: Cardiovascular;  Laterality: N/A;    Current Medications: Current Meds  Medication Sig  . aspirin 81 MG chewable tablet Chew 1 tablet (81 mg total) by mouth daily.  Marland Kitchen atorvastatin (LIPITOR) 80 MG  tablet Take 1 tablet (80 mg total) by mouth daily at 6 PM.  . Cholecalciferol (VITAMIN D3) 5000 units TABS Take 5,000 Units by mouth at bedtime.   . clopidogrel (PLAVIX) 75 MG tablet Take 1 tablet (75 mg total) by mouth daily.  Marland Kitchen loratadine (CLARITIN) 10 MG tablet Take 10 mg by mouth as needed for allergies.  . metoprolol tartrate (LOPRESSOR) 25 MG tablet Take 1 tablet (25 mg total) by mouth 2 (two) times daily.  . mometasone (NASONEX) 50 MCG/ACT nasal spray Place 2 sprays into the nose daily as needed (allergies).  . Multiple Vitamins-Minerals (CENTRUM SILVER PO) Take 1 tablet by mouth daily.  . [DISCONTINUED] fexofenadine (ALLEGRA) 180 MG tablet Take 90 mg by mouth daily as needed for allergies or rhinitis.     Allergies:   Patient has no known allergies.   Social History   Socioeconomic History  . Marital status: Married    Spouse name: Not on file  . Number of children: Not on file  . Years of education: Not on file  . Highest education level: Not on file  Occupational History  . Not on file  Social Needs  . Financial resource strain: Not on file  . Food insecurity:    Worry: Not on file    Inability: Not on file  . Transportation needs:    Medical: Not on file    Non-medical: Not on file  Tobacco Use  . Smoking status: Former Smoker  Packs/day: 1.00    Years: 30.00    Pack years: 30.00    Types: Cigarettes    Last attempt to quit: 11/09/2016    Years since quitting: 0.9  . Smokeless tobacco: Never Used  Substance and Sexual Activity  . Alcohol use: No  . Drug use: No  . Sexual activity: Not on file  Lifestyle  . Physical activity:    Days per week: Not on file    Minutes per session: Not on file  . Stress: Not on file  Relationships  . Social connections:    Talks on phone: Not on file    Gets together: Not on file    Attends religious service: Not on file    Active member of club or organization: Not on file    Attends meetings of clubs or organizations:  Not on file    Relationship status: Not on file  Other Topics Concern  . Not on file  Social History Narrative  . Not on file     Family History:  The patient's family history includes Heart attack in her brother.   ROS:   Please see the history of present illness.    Review of Systems  Constitution: Negative.  HENT: Negative.   Eyes: Negative.   Cardiovascular: Positive for palpitations.  Respiratory: Negative.   Hematologic/Lymphatic: Negative.   Musculoskeletal: Negative.  Negative for joint pain.  Gastrointestinal: Negative.   Genitourinary: Negative.   Neurological: Negative.   Psychiatric/Behavioral: The patient is nervous/anxious.    All other systems reviewed and are negative.   PHYSICAL EXAM:   VS:  BP 132/78   Pulse 68   Ht 5' (1.524 m)   Wt 134 lb 12.8 oz (61.1 kg)   SpO2 98%   BMI 26.33 kg/m   Physical Exam  GEN: Well nourished, well developed, in no acute distress  Neck: no JVD, carotid bruits, or masses Cardiac:RRR; no murmurs, rubs, or gallops  Respiratory:  clear to auscultation bilaterally, normal work of breathing GI: soft, nontender, nondistended, + BS Ext: without cyanosis, clubbing, or edema, Good distal pulses bilaterally Neuro:  Alert and Oriented x 3 Psych: euthymic mood, full affect  Wt Readings from Last 3 Encounters:  10/16/17 134 lb 12.8 oz (61.1 kg)  03/12/17 133 lb (60.3 kg)  12/01/16 130 lb 1.9 oz (59 kg)      Studies/Labs Reviewed:   EKG:  EKG is  ordered today.  The ekg ordered today demonstrates normal sinus rhythm no acute change  Recent Labs: 11/17/2016: ALT 16 11/18/2016: Hemoglobin 15.0; Platelets 248 11/19/2016: BUN 9; Creatinine, Ser 0.52; Potassium 4.1; Sodium 139   Lipid Panel    Component Value Date/Time   CHOL 190 11/18/2016 0030   TRIG 106 11/18/2016 0030   HDL 40 (L) 11/18/2016 0030   CHOLHDL 4.8 11/18/2016 0030   VLDL 21 11/18/2016 0030   LDLCALC 129 (H) 11/18/2016 0030    Additional studies/ records  that were reviewed today include:  Echo 11/18/16: - Left ventricle: The cavity size was normal. Systolic function was   normal. The estimated ejection fraction was in the range of 55%   to 60%. Wall motion was normal; there were no regional wall   motion abnormalities. - Aortic valve: There was mild regurgitation. - Atrial septum: No defect or patent foramen ovale was identified.   Cardiac cath 11/17/16:  Mid Cx lesion, 50 %stenosed.  Ost Cx to Prox Cx lesion, 20 %stenosed.  Prox Cx to Mid Cx  lesion, 50 %stenosed.  Prox LAD to Mid LAD lesion, 50 %stenosed.  Ost 1st Diag to 1st Diag lesion, 40 %stenosed.  Dist LAD lesion, 20 %stenosed.  Mid RCA lesion, 60 %stenosed.  A STENT RESOLUTE ONYX 4.5X18 drug eluting stent was successfully placed.  Prox RCA lesion, 99 %stenosed.  Post intervention, there is a 0% residual stenosis.  The left ventricular systolic function is normal.  LV end diastolic pressure is normal.  The left ventricular ejection fraction is greater than 65% by visual estimate.  There is no mitral valve regurgitation.   1. Acute inferior STEMI secondary to severe stenosis proximal RCA 2. Successful PTCA/DES x 1 proximal RCA 3. Moderate stenosis mid LAD 4. Moderate stenosis distal Circumflex 5. Moderate stenosis mid RCA 6. Normal LV systolic function       ASSESSMENT:    1. Coronary artery disease involving native coronary artery of native heart without angina pectoris   2. Mixed hyperlipidemia   3. Tobacco abuse   4. Chest tightness      PLAN:  In order of problems listed above:  CAD status post inferior STEMI 10/2016 treated with DES to the RCA with moderate disease on the circumflex LAD and mid RCA.  Patient was having palpitations recently but calm down with taking nitroglycerin.  She is taken a total of 20 nitroglycerin since her MI possibly not for the right reasons.  She is very anxious about her symptoms.  No chest pain.  Order exercise  Myoview to rule out ischemia.  Continue Lipitor Plavix and aspirin on metoprolol.  Will call her with the results of the stress test set its difficult for her to get here from [place].  She does not drive.  If stress test is abnormal we will make follow-up appointment otherwise see Dr. Angelena Form back in 1 year.  Mixed hyperlipidemia continue Lipitor.  LDL 73 06/14/2017  Tobacco abuse patient quit smoking.    Medication Adjustments/Labs and Tests Ordered: Current medicines are reviewed at length with the patient today.  Concerns regarding medicines are outlined above.  Medication changes, Labs and Tests ordered today are listed in the Patient Instructions below. Patient Instructions  Medication Instructions:  Your physician recommends that you continue on your current medications as directed. Please refer to the Current Medication list given to you today.   Labwork: TODAY:  CMET, TSH, & CBC  Testing/Procedures: Your physician has requested that you have en exercise stress myoview. For further information please visit [email or web address]. Please follow instruction sheet, as given.    Follow-Up:Your physician wants you to follow-up in: [place] DR. Angelena Form   You will receive a reminder letter in the mail two months in advance. If you don't receive a letter, please call our office to schedule the follow-up appointment.    Any Other Special Instructions Will Be Listed Below (If Applicable).     If you need a refill on your cardiac medications before your next appointment, please call your pharmacy.      Signed, Ermalinda Barrios, PA-C  10/16/2017 3:22 PM    [facility] [place] [address], [place], [place]  [address] Phone: [phone]; Fax: [phone]

## 2017-10-16 NOTE — Patient Instructions (Addendum)
Medication Instructions:  Your physician recommends that you continue on your current medications as directed. Please refer to the Current Medication list given to you today.   Labwork: TODAY:  CMET, TSH, & CBC  Testing/Procedures: Your physician has requested that you have en exercise stress myoview. For further information please visit [email or web address]. Please follow instruction sheet, as given.    Follow-Up:Your physician wants you to follow-up in: [place] DR. Angelena Form   You will receive a reminder letter in the mail two months in advance. If you don't receive a letter, please call our office to schedule the follow-up appointment.    Any Other Special Instructions Will Be Listed Below (If Applicable).     If you need a refill on your cardiac medications before your next appointment, please call your pharmacy.

## 2017-10-17 LAB — COMPREHENSIVE METABOLIC PANEL
A/G RATIO: 2.2 (ref 1.2–2.2)
ALT: 16 IU/L (ref 0–32)
AST: 17 IU/L (ref 0–40)
Albumin: 4.4 g/dL (ref 3.5–4.8)
Alkaline Phosphatase: 99 IU/L (ref 39–117)
BUN/Creatinine Ratio: 25 (ref 12–28)
BUN: 14 mg/dL (ref 8–27)
Bilirubin Total: 0.5 mg/dL (ref 0.0–1.2)
CALCIUM: 10 mg/dL (ref 8.7–10.3)
CO2: 23 mmol/L (ref 20–29)
Chloride: 105 mmol/L (ref 96–106)
Creatinine, Ser: 0.55 mg/dL — ABNORMAL LOW (ref 0.57–1.00)
GFR, EST AFRICAN AMERICAN: 110 mL/min/{1.73_m2} (ref >59)
GFR, EST NON AFRICAN AMERICAN: 95 mL/min/{1.73_m2} (ref >59)
GLOBULIN, TOTAL: 2 g/dL (ref 1.5–4.5)
Glucose: 91 mg/dL (ref 65–99)
POTASSIUM: 4.2 mmol/L (ref 3.5–5.2)
SODIUM: 141 mmol/L (ref 134–144)
TOTAL PROTEIN: 6.4 g/dL (ref 6.0–8.5)

## 2017-10-17 LAB — CBC
Hematocrit: 40.2 % (ref 34.0–46.6)
Hemoglobin: 13.4 g/dL (ref 11.1–15.9)
MCH: 30 pg (ref 26.6–33.0)
MCHC: 33.3 g/dL (ref 31.5–35.7)
MCV: 90 fL (ref 79–97)
PLATELETS: 300 10*3/uL (ref 150–450)
RBC: 4.46 x10E6/uL (ref 3.77–5.28)
RDW: 12.4 % (ref 12.3–15.4)
WBC: 7.6 10*3/uL (ref 3.4–10.8)

## 2017-10-17 LAB — TSH: TSH: 2.07 u[IU]/mL (ref 0.450–4.500)

## 2017-10-18 ENCOUNTER — Other Ambulatory Visit: Payer: Self-pay | Admitting: Physician Assistant

## 2017-10-18 ENCOUNTER — Telehealth (HOSPITAL_COMMUNITY): Payer: Self-pay | Admitting: *Deleted

## 2017-10-18 ENCOUNTER — Telehealth: Payer: Self-pay | Admitting: Physician Assistant

## 2017-10-18 NOTE — Telephone Encounter (Signed)
Follow Up ° ° ° ° °Returning your call from yesterday, concerning her lab results. °

## 2017-10-18 NOTE — Telephone Encounter (Signed)
Attempted to call patient regarding upcoming appointment. No answer, unable to leave message.  Tiffany Wright

## 2017-10-19 NOTE — Telephone Encounter (Signed)
-----   Message from Imogene Burn, PA-C sent at 10/17/2017  7:57 AM EDT ----- Labs stable

## 2017-10-19 NOTE — Telephone Encounter (Signed)
Returned pts call. Phone still messing up.

## 2017-10-23 ENCOUNTER — Telehealth (HOSPITAL_COMMUNITY): Payer: Self-pay | Admitting: *Deleted

## 2017-10-23 NOTE — Telephone Encounter (Signed)
Patient given detailed instructions per Myocardial Perfusion Study Information Sheet for the test on 10/24/17 at 0945. Patient notified to arrive 15 minutes early and that it is imperative to arrive on time for appointment to keep from having the test rescheduled.  If you need to cancel or reschedule your appointment, please call the office within 24 hours of your appointment. . Patient verbalized understanding.Augustine Brannick, Ranae Palms

## 2017-10-23 NOTE — Telephone Encounter (Signed)
Attempted to call patient regarding upcoming appointment- no answer, unable to leave message.  Kirstie Peri, RN

## 2017-10-24 ENCOUNTER — Telehealth: Payer: Self-pay | Admitting: Physician Assistant

## 2017-10-24 ENCOUNTER — Ambulatory Visit (HOSPITAL_COMMUNITY): Payer: 59 | Attending: Cardiovascular Disease

## 2017-10-24 DIAGNOSIS — R0609 Other forms of dyspnea: Secondary | ICD-10-CM | POA: Insufficient documentation

## 2017-10-24 DIAGNOSIS — Z87891 Personal history of nicotine dependence: Secondary | ICD-10-CM | POA: Insufficient documentation

## 2017-10-24 DIAGNOSIS — Z720 Tobacco use: Secondary | ICD-10-CM | POA: Diagnosis not present

## 2017-10-24 DIAGNOSIS — R0789 Other chest pain: Secondary | ICD-10-CM | POA: Diagnosis not present

## 2017-10-24 DIAGNOSIS — I2510 Atherosclerotic heart disease of native coronary artery without angina pectoris: Secondary | ICD-10-CM | POA: Diagnosis not present

## 2017-10-24 DIAGNOSIS — R002 Palpitations: Secondary | ICD-10-CM | POA: Insufficient documentation

## 2017-10-24 LAB — MYOCARDIAL PERFUSION IMAGING
CHL CUP RESTING HR STRESS: 85 {beats}/min
CSEPEDS: 0 s
CSEPHR: 94 %
CSEPPHR: 141 {beats}/min
Estimated workload: 7 METS
Exercise duration (min): 5 min
LVDIAVOL: 44 mL (ref 46–106)
LVSYSVOL: 11 mL
MPHR: 150 {beats}/min
RATE: 0.38
SDS: 1
SRS: 9
SSS: 7
TID: 0.97

## 2017-10-24 MED ORDER — TECHNETIUM TC 99M TETROFOSMIN IV KIT
10.8000 | PACK | Freq: Once | INTRAVENOUS | Status: AC | PRN
  Administered 2017-10-24: 10.8 via INTRAVENOUS
  Filled 2017-10-24: qty 11

## 2017-10-24 MED ORDER — TECHNETIUM TC 99M TETROFOSMIN IV KIT
31.6000 | PACK | Freq: Once | INTRAVENOUS | Status: AC | PRN
  Administered 2017-10-24: 31.6 via INTRAVENOUS
  Filled 2017-10-24: qty 32

## 2017-10-24 NOTE — Telephone Encounter (Signed)
New Message: ° ° ° ° ° ° °Pt is returning a call °

## 2017-10-25 NOTE — Telephone Encounter (Signed)
Returned pts call and left a message for pt to call back. 

## 2017-10-25 NOTE — Telephone Encounter (Signed)
-----   Message from Imogene Burn, PA-C sent at 10/24/2017  3:24 PM EDT ----- Stress test completely normal.  Follow-up with Dr. Angelena Form in 1 year

## 2017-11-01 ENCOUNTER — Other Ambulatory Visit: Payer: Self-pay | Admitting: Physician Assistant

## 2017-12-05 DIAGNOSIS — M79671 Pain in right foot: Secondary | ICD-10-CM | POA: Diagnosis not present

## 2017-12-07 DIAGNOSIS — Z1339 Encounter for screening examination for other mental health and behavioral disorders: Secondary | ICD-10-CM | POA: Diagnosis not present

## 2017-12-07 DIAGNOSIS — Z6824 Body mass index (BMI) 24.0-24.9, adult: Secondary | ICD-10-CM | POA: Diagnosis not present

## 2017-12-07 DIAGNOSIS — S9031XA Contusion of right foot, initial encounter: Secondary | ICD-10-CM | POA: Diagnosis not present

## 2018-02-19 ENCOUNTER — Other Ambulatory Visit: Payer: Self-pay | Admitting: Internal Medicine

## 2018-02-19 NOTE — Telephone Encounter (Signed)
Please review for refill. Thanks!  

## 2018-03-21 DIAGNOSIS — R300 Dysuria: Secondary | ICD-10-CM | POA: Diagnosis not present

## 2018-03-21 DIAGNOSIS — Z6824 Body mass index (BMI) 24.0-24.9, adult: Secondary | ICD-10-CM | POA: Diagnosis not present

## 2018-03-21 DIAGNOSIS — Z2821 Immunization not carried out because of patient refusal: Secondary | ICD-10-CM | POA: Diagnosis not present

## 2018-03-21 DIAGNOSIS — Z0000 Encounter for general adult medical examination without abnormal findings: Secondary | ICD-10-CM | POA: Diagnosis not present

## 2018-05-24 ENCOUNTER — Other Ambulatory Visit: Payer: Self-pay | Admitting: Cardiovascular Disease

## 2018-06-28 DIAGNOSIS — J302 Other seasonal allergic rhinitis: Secondary | ICD-10-CM | POA: Diagnosis not present

## 2018-08-25 ENCOUNTER — Other Ambulatory Visit: Payer: Self-pay | Admitting: Physician Assistant

## 2018-10-10 ENCOUNTER — Telehealth: Payer: Self-pay

## 2018-10-10 NOTE — Telephone Encounter (Signed)

## 2018-10-15 NOTE — Progress Notes (Signed)
Virtual Visit via Telephone Note   This visit type was conducted due to national recommendations for restrictions regarding the COVID-19 Pandemic (e.g. social distancing) in an effort to limit this patient's exposure and mitigate transmission in our community.  Due to her co-morbid illnesses, this patient is at least at moderate risk for complications without adequate follow up.  This format is felt to be most appropriate for this patient at this time.  The patient did not have access to video technology/had technical difficulties with video requiring transitioning to audio format only (telephone).  All issues noted in this document were discussed and addressed.  No physical exam could be performed with this format.  Please refer to the patient's chart for her  consent to telehealth for [facility].   Date:  10/16/2018   ID:  Tiffany Wright, DOB 27-Jun-1945, MRN [id number]  Patient Location: Home Provider Location: Home  PCP:  Myer Peer, MD  Cardiologist:  Lauree Chandler, MD   Electrophysiologist:  None   Evaluation Performed:  Follow-Up Visit  Chief Complaint:  Follow up  History of Present Illness:    Tiffany Wright is a 73 y.o. female with CAD status post inferior STEMI 11/17/2016 treated with DES to the RCA, residual moderate nonobstructive disease in the mid RCA mid LAD and mid circumflex.  Follow-up echo 10/2016 normal LV function.  Patient was switched from Brilinta to Plavix for shortness of breath.  Also has HLD and tobacco abuse.   I saw the patient last year when she was having a lot of palpitations after stopping wellbutrin abruptly and taking NTG for it.Patient had a lot of anxiety around it so NST was ordered and low risk without ischemia.  Patient has been out of work for 7 weeks. Has had a couple episodes of chest tightness when she was under stress that eased quickly with 1 NTG. Has once every 2 or 3 weeks. Walks 3 days a week 1 1/2 miles up a steep hill and breaths  hard up an incline but no chest tightness. She walks fast and feels really good when she exercises.  The patient does not have symptoms concerning for COVID-19 infection (fever, chills, cough, or new shortness of breath).    Past Medical History:  Diagnosis Date  . CAD (coronary artery disease)    a. 10/2016: inferior STEMI s/p DES to RCA  . Hyperlipidemia   . Tobacco abuse    Past Surgical History:  Procedure Laterality Date  . CHOLECYSTECTOMY    . CORONARY STENT INTERVENTION N/A 11/17/2016   Procedure: Coronary Stent Intervention;  Surgeon: Burnell Blanks, MD;  Location: [place] [facility];  Service: Cardiovascular;  Laterality: N/A;  . LEFT HEART CATH AND CORONARY ANGIOGRAPHY N/A 11/17/2016   Procedure: Left Heart Cath and Coronary Angiography;  Surgeon: Burnell Blanks, MD;  Location: [place] [facility];  Service: Cardiovascular;  Laterality: N/A;     Current Meds  Medication Sig  . aspirin 81 MG chewable tablet Chew 1 tablet (81 mg total) by mouth daily.  Marland Kitchen atorvastatin (LIPITOR) 80 MG tablet TAKE 1 TABLET DAILY AT 6PM  . Cholecalciferol (VITAMIN D3) 5000 units TABS Take 5,000 Units by mouth at bedtime.   . clopidogrel (PLAVIX) 75 MG tablet Take 1 tablet (75 mg total) by mouth daily.  Marland Kitchen loratadine (CLARITIN) 10 MG tablet Take 10 mg by mouth daily.   . metoprolol tartrate (LOPRESSOR) 25 MG tablet Take 1 tablet (25 mg total) by mouth 2 (  two) times daily.  . mometasone (NASONEX) 50 MCG/ACT nasal spray Place 2 sprays into the nose daily as needed (allergies).  . Multiple Vitamins-Minerals (CENTRUM SILVER PO) Take 1 tablet by mouth daily.  . nitroGLYCERIN (NITROSTAT) 0.4 MG SL tablet Place 1 tablet (0.4 mg total) under the tongue every 5 (five) minutes as needed for chest pain.  . [DISCONTINUED] atorvastatin (LIPITOR) 80 MG tablet TAKE 1 TABLET DAILY AT 6PM  . [DISCONTINUED] clopidogrel (PLAVIX) 75 MG tablet Take 1 tablet (75 mg total) by mouth daily.  . [DISCONTINUED]  metoprolol tartrate (LOPRESSOR) 25 MG tablet TAKE 1 TABLET TWICE A DAY     Allergies:   Patient has no known allergies.   Social History   Tobacco Use  . Smoking status: Former Smoker    Packs/day: 1.00    Years: 30.00    Pack years: 30.00    Types: Cigarettes    Last attempt to quit: 11/09/2016    Years since quitting: 1.9  . Smokeless tobacco: Never Used  Substance Use Topics  . Alcohol use: No  . Drug use: No     Family Hx: The patient's family history includes Heart attack in her brother.  ROS:   Please see the history of present illness.      All other systems reviewed and are negative.   Prior CV studies:   The following studies were reviewed today:   NST 5/29/19Nuclear stress EF: 75%. The left ventricular ejection fraction is hyperdynamic (>65%).  The patient walked for a total of 5 minutes on a Bruce protocol treadmill test. Patient achieved a peak heart rate of 141 which is 94% predicted maximal heart rate. There were no ST or T wave changes to suggest ischemia. The blood pressure response to exercise was normal.  The study is normal. No evidence of ischemia. There is no evidence of previous infarction.  This is a low risk study.   Echo 11/18/16: - Left ventricle: The cavity size was normal. Systolic function was   normal. The estimated ejection fraction was in the range of 55%   to 60%. Wall motion was normal; there were no regional wall   motion abnormalities. - Aortic valve: There was mild regurgitation. - Atrial septum: No defect or patent foramen ovale was identified.   Cardiac cath 11/17/16:  Mid Cx lesion, 50 %stenosed.  Ost Cx to Prox Cx lesion, 20 %stenosed.  Prox Cx to Mid Cx lesion, 50 %stenosed.  Prox LAD to Mid LAD lesion, 50 %stenosed.  Ost 1st Diag to 1st Diag lesion, 40 %stenosed.  Dist LAD lesion, 20 %stenosed.  Mid RCA lesion, 60 %stenosed.  A STENT RESOLUTE ONYX 4.5X18 drug eluting stent was successfully placed.  Prox RCA  lesion, 99 %stenosed.  Post intervention, there is a 0% residual stenosis.  The left ventricular systolic function is normal.  LV end diastolic pressure is normal.  The left ventricular ejection fraction is greater than 65% by visual estimate.  There is no mitral valve regurgitation.   1. Acute inferior STEMI secondary to severe stenosis proximal RCA 2. Successful PTCA/DES x 1 proximal RCA 3. Moderate stenosis mid LAD 4. Moderate stenosis distal Circumflex 5. Moderate stenosis mid RCA 6. Normal LV systolic function           Labs/Other Tests and Data Reviewed:    EKG:  An ECG dated 10/16/17 was personally reviewed today and demonstrated:  NSR no acute change  Recent Labs: 10/16/2017: ALT 16; BUN 14; Creatinine, Ser 0.55;  Hemoglobin 13.4; Platelets 300; Potassium 4.2; Sodium 141; TSH 2.070   Recent Lipid Panel Lab Results  Component Value Date/Time   CHOL 190 11/18/2016 12:30 AM   TRIG 106 11/18/2016 12:30 AM   HDL 40 (L) 11/18/2016 12:30 AM   CHOLHDL 4.8 11/18/2016 12:30 AM   LDLCALC 129 (H) 11/18/2016 12:30 AM    Wt Readings from Last 3 Encounters:  10/16/18 139 lb (63 kg)  10/24/17 134 lb (60.8 kg)  10/16/17 134 lb 12.8 oz (61.1 kg)     Objective:    Vital Signs:  Ht 5' (1.524 m)   Wt 139 lb (63 kg)   BMI 27.15 kg/m    VITAL SIGNS:  reviewed  ASSESSMENT & PLAN:    Chest tightness every 2-3 weeks relieved quickly with 1 NTG always with stress/anxiety, not exertional and hasn't changed in the past year. NST 09/2017- low risk without ischemia. Patient truly thinks it's stress/anxiety and doesn't want to pursue testing at this time. I told her to come to ER for any prolonged chest pain. On Plavix and ASA & metoprolol  CAD status post inferior STEMI 10/2016 treated with DES to the RCA with moderate disease on the circumflex LAD and mid RCA. Low risk NST 10/24/17  Hyperlipidemia LDL 73 05/2017 on Lipitor needs FLP she would like to let PCP do it in [place].    COVID-19 Education: The signs and symptoms of COVID-19 were discussed with the patient and how to seek care for testing (follow up with PCP or arrange E-visit).  The importance of social distancing was discussed today.  Time:   Today, I have spent 22:25 minutes with the patient with telehealth technology discussing the above problems.     Medication Adjustments/Labs and Tests Ordered: Current medicines are reviewed at length with the patient today.  Concerns regarding medicines are outlined above.   Tests Ordered: No orders of the defined types were placed in this encounter.   Medication Changes: Meds ordered this encounter  Medications  . metoprolol tartrate (LOPRESSOR) 25 MG tablet    Sig: Take 1 tablet (25 mg total) by mouth 2 (two) times daily.    Dispense:  180 tablet    Refill:  1  . clopidogrel (PLAVIX) 75 MG tablet    Sig: Take 1 tablet (75 mg total) by mouth daily.    Dispense:  90 tablet    Refill:  2  . atorvastatin (LIPITOR) 80 MG tablet    Sig: TAKE 1 TABLET DAILY AT 6PM    Dispense:  90 tablet    Refill:  3    Disposition:  Follow up in 6 month(s) Dr. Angelena Form  Signed, Ermalinda Barrios, PA-C  10/16/2018 9:46 AM    [place]

## 2018-10-16 ENCOUNTER — Other Ambulatory Visit: Payer: Self-pay

## 2018-10-16 ENCOUNTER — Encounter: Payer: Self-pay | Admitting: Physician Assistant

## 2018-10-16 ENCOUNTER — Telehealth (INDEPENDENT_AMBULATORY_CARE_PROVIDER_SITE_OTHER): Payer: BLUE CROSS/BLUE SHIELD | Admitting: Physician Assistant

## 2018-10-16 VITALS — Ht 60.0 in | Wt 139.0 lb

## 2018-10-16 DIAGNOSIS — E782 Mixed hyperlipidemia: Secondary | ICD-10-CM

## 2018-10-16 DIAGNOSIS — I2510 Atherosclerotic heart disease of native coronary artery without angina pectoris: Secondary | ICD-10-CM | POA: Diagnosis not present

## 2018-10-16 MED ORDER — CLOPIDOGREL BISULFATE 75 MG PO TABS: 75.0000 mg | ORAL_TABLET | Freq: Every day | ORAL | 2 refills | Status: DC

## 2018-10-16 MED ORDER — METOPROLOL TARTRATE 25 MG PO TABS: 25.0000 mg | ORAL_TABLET | Freq: Two times a day (BID) | ORAL | 1 refills | Status: DC

## 2018-10-16 MED ORDER — ATORVASTATIN CALCIUM 80 MG PO TABS: ORAL_TABLET | ORAL | 3 refills | Status: DC

## 2018-10-16 NOTE — Patient Instructions (Signed)
Medication Instructions: Your physician recommends that you continue on your current medications as directed. Please refer to the Current Medication list given to you today. 1.  All cardiac medications were sent in today to requested pharmacy.   Labwork: Will get a copy of lab work faxed to office from patients PCP.  Testing/Procedures: -None  Follow-Up: Your physician wants you to follow-up in: 6 months with Dr. Angelena Form.  You will receive a reminder letter in the mail two months in advance. If you don't receive a letter, please call our office to schedule the follow-up appointment.   Any Other Special Instructions Will Be Listed Below (If Applicable).  If you are having prolonged chest pain please go to the ER.    If you need a refill on your cardiac medications before your next appointment, please call your pharmacy.

## 2018-10-17 DIAGNOSIS — Z6825 Body mass index (BMI) 25.0-25.9, adult: Secondary | ICD-10-CM | POA: Diagnosis not present

## 2018-10-17 DIAGNOSIS — I2510 Atherosclerotic heart disease of native coronary artery without angina pectoris: Secondary | ICD-10-CM | POA: Diagnosis not present

## 2018-10-17 DIAGNOSIS — J302 Other seasonal allergic rhinitis: Secondary | ICD-10-CM | POA: Diagnosis not present

## 2018-10-17 DIAGNOSIS — Z79899 Other long term (current) drug therapy: Secondary | ICD-10-CM | POA: Diagnosis not present

## 2018-11-07 ENCOUNTER — Other Ambulatory Visit: Payer: Self-pay | Admitting: Cardiovascular Disease

## 2018-11-12 ENCOUNTER — Other Ambulatory Visit: Payer: Self-pay | Admitting: Physician Assistant

## 2018-11-12 NOTE — Telephone Encounter (Signed)
Outpatient Medication Detail   Disp Refills Start End   atorvastatin (LIPITOR) 80 MG tablet 90 tablet 3 10/16/2018    Sig: TAKE 1 TABLET DAILY AT 6PM   Sent to pharmacy as: atorvastatin (LIPITOR) 80 MG tablet   E-Prescribing Status: Receipt confirmed by pharmacy (10/16/2018 9:26 AM EDT)   Pharmacy  [facility] [phone] - [place], [place]

## 2018-11-13 ENCOUNTER — Other Ambulatory Visit: Payer: Self-pay | Admitting: Cardiovascular Disease

## 2018-11-13 MED ORDER — NITROGLYCERIN 0.4 MG SL SUBL: 0.4000 mg | SUBLINGUAL_TABLET | SUBLINGUAL | 3 refills | Status: DC | PRN

## 2018-11-13 MED ORDER — ATORVASTATIN CALCIUM 80 MG PO TABS: ORAL_TABLET | ORAL | 3 refills | Status: DC

## 2018-11-13 MED ORDER — METOPROLOL TARTRATE 25 MG PO TABS: 25.0000 mg | ORAL_TABLET | Freq: Two times a day (BID) | ORAL | 3 refills | Status: DC

## 2018-11-13 MED ORDER — CLOPIDOGREL BISULFATE 75 MG PO TABS: 75.0000 mg | ORAL_TABLET | Freq: Every day | ORAL | 3 refills | Status: DC

## 2018-11-13 NOTE — Telephone Encounter (Signed)
New message      *STAT* If patient is at the pharmacy, call can be transferred to refill team.   1. Which medications need to be refilled? (please list name of each medication and dose if known) clopidogrel (PLAVIX) 75 MG tablet   atorvastatin (LIPITOR) 80 MG tablet   metoprolol tartrate (LOPRESSOR) 25 MG tablet  nitroGLYCERIN (NITROSTAT) 0.4 MG SL tablet  2. Which pharmacy/location (including street and city if local pharmacy) is medication to be sent to?[id number] - [place], [place] [address]  3. Do they need a 30 day or 90 day supply? [place]

## 2018-12-05 ENCOUNTER — Other Ambulatory Visit: Payer: Self-pay | Admitting: Physician Assistant

## 2018-12-26 ENCOUNTER — Telehealth: Payer: Self-pay | Admitting: Cardiovascular Disease

## 2018-12-26 MED ORDER — VASCEPA 1 G PO CAPS: 2.0000 g | ORAL_CAPSULE | Freq: Two times a day (BID) | ORAL | 6 refills | Status: DC

## 2018-12-26 NOTE — Telephone Encounter (Signed)
I spoke with pt. She is willing to take twice daily medication. She is concerned about cost.  She would like to try Vascepa and will let us know if it is too expensive.  [facility] [place]  She will have fasting lipid profile done in primary care in October and have results sent to our office.

## 2018-12-26 NOTE — Telephone Encounter (Signed)
Recommend that pt take Vascepa twice daily since this is the only way it has been studied (2 grams twice a day). If she takes it once daily, she would need to take 4 capsules at the same time each day.

## 2018-12-26 NOTE — Telephone Encounter (Signed)
Will check with pharmacist regarding what dose  Vascepa would be if pt wanted to take once daily.  (see lab result note)

## 2018-12-26 NOTE — Telephone Encounter (Signed)
New message   Patient states that she is returning call for lab results. Please call.

## 2019-01-02 ENCOUNTER — Other Ambulatory Visit: Payer: Self-pay | Admitting: Cardiovascular Disease

## 2019-01-09 DIAGNOSIS — L720 Epidermal cyst: Secondary | ICD-10-CM | POA: Diagnosis not present

## 2019-01-29 DIAGNOSIS — L720 Epidermal cyst: Secondary | ICD-10-CM | POA: Diagnosis not present

## 2019-02-17 ENCOUNTER — Other Ambulatory Visit: Payer: Self-pay | Admitting: Physician Assistant

## 2019-03-25 DIAGNOSIS — F418 Other specified anxiety disorders: Secondary | ICD-10-CM | POA: Diagnosis not present

## 2019-03-25 DIAGNOSIS — R0789 Other chest pain: Secondary | ICD-10-CM | POA: Diagnosis not present

## 2019-03-28 ENCOUNTER — Emergency Department (HOSPITAL_COMMUNITY): Payer: BC Managed Care – PPO

## 2019-03-28 ENCOUNTER — Emergency Department (HOSPITAL_COMMUNITY)
Admission: EM | Admit: 2019-03-28 | Discharge: 2019-03-28 | Disposition: A | Discharge status: Home / Self Care | Payer: BC Managed Care – PPO | Attending: Emergency Medicine | Admitting: Emergency Medicine

## 2019-03-28 ENCOUNTER — Encounter (HOSPITAL_COMMUNITY): Payer: Self-pay

## 2019-03-28 ENCOUNTER — Other Ambulatory Visit: Payer: Self-pay

## 2019-03-28 DIAGNOSIS — Z87891 Personal history of nicotine dependence: Secondary | ICD-10-CM | POA: Insufficient documentation

## 2019-03-28 DIAGNOSIS — Z7901 Long term (current) use of anticoagulants: Secondary | ICD-10-CM | POA: Insufficient documentation

## 2019-03-28 DIAGNOSIS — R110 Nausea: Secondary | ICD-10-CM | POA: Diagnosis not present

## 2019-03-28 DIAGNOSIS — I252 Old myocardial infarction: Secondary | ICD-10-CM | POA: Insufficient documentation

## 2019-03-28 DIAGNOSIS — I77819 Aortic ectasia, unspecified site: Secondary | ICD-10-CM

## 2019-03-28 DIAGNOSIS — R0902 Hypoxemia: Secondary | ICD-10-CM | POA: Diagnosis not present

## 2019-03-28 DIAGNOSIS — I2510 Atherosclerotic heart disease of native coronary artery without angina pectoris: Secondary | ICD-10-CM | POA: Diagnosis not present

## 2019-03-28 DIAGNOSIS — Z79899 Other long term (current) drug therapy: Secondary | ICD-10-CM | POA: Insufficient documentation

## 2019-03-28 DIAGNOSIS — I10 Essential (primary) hypertension: Secondary | ICD-10-CM | POA: Insufficient documentation

## 2019-03-28 DIAGNOSIS — Z7982 Long term (current) use of aspirin: Secondary | ICD-10-CM | POA: Insufficient documentation

## 2019-03-28 DIAGNOSIS — R0789 Other chest pain: Secondary | ICD-10-CM | POA: Diagnosis not present

## 2019-03-28 DIAGNOSIS — R079 Chest pain, unspecified: Secondary | ICD-10-CM | POA: Diagnosis not present

## 2019-03-28 LAB — CBC
HCT: 39.5 % (ref 36.0–46.0)
Hemoglobin: 13.1 g/dL (ref 12.0–15.0)
MCH: 30.3 pg (ref 26.0–34.0)
MCHC: 33.2 g/dL (ref 30.0–36.0)
MCV: 91.4 fL (ref 80.0–100.0)
Platelets: 264 10*3/uL (ref 150–400)
RBC: 4.32 MIL/uL (ref 3.87–5.11)
RDW: 11.9 % (ref 11.5–15.5)
WBC: 10.1 10*3/uL (ref 4.0–10.5)
nRBC: 0 % (ref 0.0–0.2)

## 2019-03-28 LAB — TROPONIN I (HIGH SENSITIVITY)
Troponin I (High Sensitivity): 3 ng/L (ref <18)
Troponin I (High Sensitivity): 4 ng/L (ref <18)

## 2019-03-28 LAB — BASIC METABOLIC PANEL
Anion gap: 11 (ref 5–15)
BUN: 10 mg/dL (ref 8–23)
CO2: 22 mmol/L (ref 22–32)
Calcium: 10.4 mg/dL — ABNORMAL HIGH (ref 8.9–10.3)
Chloride: 105 mmol/L (ref 98–111)
Creatinine, Ser: 0.68 mg/dL (ref 0.44–1.00)
GFR calc Af Amer: >60 mL/min (ref >60)
GFR calc non Af Amer: >60 mL/min (ref >60)
Glucose, Bld: 98 mg/dL (ref 70–99)
Potassium: 3.6 mmol/L (ref 3.5–5.1)
Sodium: 138 mmol/L (ref 135–145)

## 2019-03-28 LAB — D-DIMER, QUANTITATIVE: D-Dimer, Quant: 1.13 ug/mL-FEU — ABNORMAL HIGH (ref 0.00–0.50)

## 2019-03-28 MED ORDER — SODIUM CHLORIDE 0.9% FLUSH
3.0000 mL | Freq: Once | INTRAVENOUS | Status: AC
  Administered 2019-03-28: 3 mL via INTRAVENOUS

## 2019-03-28 MED ORDER — IOHEXOL 350 MG/ML SOLN
75.0000 mL | Freq: Once | INTRAVENOUS | Status: AC | PRN
  Administered 2019-03-28: 20:00:00 75 mL via INTRAVENOUS

## 2019-03-28 MED ORDER — MORPHINE SULFATE (PF) 4 MG/ML IV SOLN
4.0000 mg | Freq: Once | INTRAVENOUS | Status: AC
  Administered 2019-03-28: 4 mg via INTRAVENOUS
  Filled 2019-03-28: qty 1

## 2019-03-28 MED ORDER — OXYCODONE HCL 5 MG PO TABS: 5.0000 mg | ORAL_TABLET | Freq: Four times a day (QID) | ORAL | 0 refills | Status: AC | PRN

## 2019-03-28 NOTE — ED Triage Notes (Signed)
Pt BIB [facility] from home. Pt complaining of substernal CP x1 week. Pt was seen by PCP Tuesday and given medication for anxiety. Pt condition not improved. Pain 10/10 upon EMS arrival. Pt received 324 asa and x3 nitro. Pain to 7/10. Pt with history of HTN. BP 190/110 following nitro x3. Pt with history of MI and stent placement in 2016.

## 2019-03-28 NOTE — Discharge Instructions (Addendum)
You were seen in the ER for chest pain.  Cardiac work-up today was normal.  CT scan did not show pulmonary embolism/blood clot but incidentally found minimal enlargement of your aorta.  This measures 4 cm, with the normal being 3.5 cm.  Typically, cardiothoracic surgery is considered when the aorta continues to enlarge and gets larger than 5.5 cm.  I spoke to Dr. Kipp Brood with cardiothoracic surgery who does not recommend any interventions currently or emergently.  Follow-up with your primary care doctor or cardiothoracic surgery for observation and annual checkups for this.  The cause of your chest pain is unclear but I suspect it is related to a soft tissue injury like a muscle strain.  Take 1000 mg of acetaminophen every 6 hours for mild and moderate pain.  You can apply topical diclofenac gel every 6 hours to the area and massage to help with pain as well.  For breakthrough or severe pain take oxycodone 5 mg.  Oxycodone is a narcotic pain medication that has risk of overdose, death, dependence and abuse. Mild and expected side effects include nausea, stomach upset, drowsiness, constipation. Do not consume alcohol, drive or use heavy machinery while taking this medication. Do not leave unattended around children. Flush any remaining pills that you do not use and do not share.  The emergency department has a strict policy regarding prescription of narcotic medications. We prescribe a short course for acute, new pain or injuries. We are unable to refill this medication in the emergency department for chronic pain or repeatedly.  Refill need to be done by specialist or primary care provider or pain clinic.  Contact your primary care provider or specialist for chronic pain management and refill on narcotic medications.

## 2019-03-28 NOTE — ED Notes (Signed)
Main lab to add on d dimer 

## 2019-03-28 NOTE — ED Notes (Signed)
ED Provider at bedside. 

## 2019-03-28 NOTE — ED Provider Notes (Signed)
[facility] EMERGENCY DEPARTMENT Provider Note   CSN: [id number] Arrival date & time: 03/28/19  1721     History   Chief Complaint Chief Complaint  Patient presents with  . Chest Pain    HPI Tiffany Wright is a 73 y.o. female with history of CAD s/p inferior STEMI and stent 2016, on clopidogrel, hypertension, hyperlipidemia, tobacco use, cholecystectomy presents to the ER for relation of chest pain that began last Friday.  Located to the right side with intermittent radiation to the middle, right neck and her right arm feels funny "numb" and heavy sometimes.  Has associated nausea.  States her heart attack several years ago was on the right side as well.  She is worried that there is something wrong with her stent.  Admits increased anxiety and initially thought it was related to this.  Saw her PCP about this last week who prescribed her anxiety medicines but this has not helped.  Today the chest pain got significantly worse which is why she came to the ER for evaluation.  The pain is pretty much constant and sometimes eases up in severity but never truly goes away.  She took 2 nitroglycerin at home and it did not help.  EMS gave her 1 more nitroglycerin and aspirin but states the pain is now worse than before.  Has tried ibuprofen without relief.  Also has tried Tums because she thought it was related to acid reflux but it did not help.  The pain is worse when she moves her right arm, changes position and sits forward.  She is right-hand dominant and sews and uses her right arm to push the machine which occasionally also makes the chest pain worse.  Has history of allergies and has occasional rare cough but this is unchanged.  She denies any fever, vomiting, diaphoresis, palpitations.  No severe abdominal or back pain, distal tingling or numbness to the extremities.  No pleuritic pain.  No leg swelling or calf pain.  No history of DVT or PE.  No history of active cancer, recent  immobilization. HPI  Past Medical History:  Diagnosis Date  . CAD (coronary artery disease)    a. 10/2016: inferior STEMI s/p DES to RCA  . Hyperlipidemia   . Tobacco abuse     Patient Active Problem List   Diagnosis Date Noted  . CAD (coronary artery disease)   . Hyperlipidemia   . Tobacco abuse   . Status post coronary artery stent placement   . Acute ST elevation myocardial infarction (STEMI) of inferior wall [facility])     Past Surgical History:  Procedure Laterality Date  . CHOLECYSTECTOMY    . CORONARY STENT INTERVENTION N/A 11/17/2016   Procedure: Coronary Stent Intervention;  Surgeon: Burnell Blanks, MD;  Location: [place];  Service: Cardiovascular;  Laterality: N/A;  . LEFT HEART CATH AND CORONARY ANGIOGRAPHY N/A 11/17/2016   Procedure: Left Heart Cath and Coronary Angiography;  Surgeon: Burnell Blanks, MD;  Location: [place] [facility];  Service: Cardiovascular;  Laterality: N/A;     OB History   No obstetric history on file.      Home Medications    Prior to Admission medications   Medication Sig Start Date End Date Taking? Authorizing Provider  aspirin 81 MG chewable tablet Chew 1 tablet (81 mg total) by mouth daily. 11/20/16   Eileen Stanford, PA-C  atorvastatin (LIPITOR) 80 MG tablet TAKE 1 TABLET DAILY AT [facility] 12/05/18  Eileen Stanford, PA-C  Cholecalciferol (VITAMIN D3) 5000 units TABS Take 5,000 Units by mouth at bedtime.     [provider]  clopidogrel (PLAVIX) 75 MG tablet Take 1 tablet (75 mg total) by mouth daily. 11/13/18   Imogene Burn, PA-C  loratadine (CLARITIN) 10 MG tablet Take 10 mg by mouth daily.     [provider]  metoprolol tartrate (LOPRESSOR) 25 MG tablet Take 1 tablet (25 mg total) by mouth 2 (two) times daily. 11/13/18   Imogene Burn, PA-C  mometasone (NASONEX) 50 MCG/ACT nasal spray Place 2 sprays into the nose daily as needed (allergies).    [provider]  Multiple  Vitamins-Minerals (CENTRUM SILVER PO) Take 1 tablet by mouth daily.    [provider]  nitroGLYCERIN (NITROSTAT) 0.4 MG SL tablet PLACE 1 TABLET (0.4 MG TOTAL) UNDER THE TONGUE EVERY 5 (FIVE) MINUTES AS NEEDED FOR CHEST PAIN. 01/02/19   Burnell Blanks, MD  oxyCODONE (OXY IR/ROXICODONE) 5 MG immediate release tablet Take 1 tablet (5 mg total) by mouth every 6 (six) hours as needed for up to 2 days for severe pain. 03/28/19 03/30/19  Kinnie Feil, PA-C  VASCEPA 1 g CAPS TAKE 2 CAPSULES (2 G TOTAL) BY MOUTH 2 (TWO) TIMES DAILY. 02/17/19   Imogene Burn, PA-C    Family History Family History  Problem Relation Age of Onset  . Heart attack Brother     Social History Social History   Tobacco Use  . Smoking status: Former Smoker    Packs/day: 1.00    Years: 30.00    Pack years: 30.00    Types: Cigarettes    Quit date: 11/09/2016    Years since quitting: 2.3  . Smokeless tobacco: Never Used  Substance Use Topics  . Alcohol use: No  . Drug use: No     Allergies   Patient has no known allergies.   Review of Systems Review of Systems  Cardiovascular: Positive for chest pain.  Gastrointestinal: Positive for nausea.  All other systems reviewed and are negative.    Physical Exam Updated Vital Signs BP (!) 141/76   Pulse 65   Temp 97.9 F (36.6 C) (Oral)   Resp 13   Ht 5\' 2"  (1.575 m)   Wt 62.1 kg   SpO2 100%   BMI 25.06 kg/m   Physical Exam Constitutional:      Appearance: She is well-developed.     Comments: Nontoxic, appears anxious.  HENT:     Head: Normocephalic and atraumatic.     Nose: Nose normal.  Eyes:     General: Lids are normal.     Conjunctiva/sclera: Conjunctivae normal.  Neck:     Musculoskeletal: Normal range of motion.     Trachea: Trachea normal.     Comments: Trachea midline.  Cardiovascular:     Rate and Rhythm: Normal rate and regular rhythm.     Pulses:          Radial pulses are 1+ on the right side and 1+ on the  left side.       Dorsalis pedis pulses are 1+ on the right side and 1+ on the left side.     Heart sounds: Normal heart sounds, S1 normal and S2 normal.     Comments: No LE edema or calf tenderness.  Pulmonary:     Effort: Pulmonary effort is normal.     Breath sounds: Normal breath sounds.  Chest:  Chest wall: Tenderness present.     Comments: Right-sided chest pain is reproduced with palpation of the right parasternal area, active range of motion against resistance of the upper extremities, sitting up. Abdominal:     General: Bowel sounds are normal.     Palpations: Abdomen is soft.     Tenderness: There is no abdominal tenderness.     Comments: No epigastric tenderness. No distention.  Abdomen is soft.  No pulsatility.  Skin:    General: Skin is warm and dry.     Capillary Refill: Capillary refill takes less than 2 seconds.     Comments: Skin is normal on the chest  Neurological:     Mental Status: She is alert.     GCS: GCS eye subscore is 4. GCS verbal subscore is 5. GCS motor subscore is 6.     Comments: Sensation and strength is intact in upper and lower extremities  Psychiatric:        Speech: Speech normal.        Behavior: Behavior normal.        Thought Content: Thought content normal.      ED Treatments / Results  Labs (all labs ordered are listed, but only abnormal results are displayed) Labs Reviewed  BASIC METABOLIC PANEL - Abnormal; Notable for the following components:      Result Value   Calcium 10.4 (*)    All other components within normal limits  D-DIMER, QUANTITATIVE (NOT AT [facility]) - Abnormal; Notable for the following components:   D-Dimer, Quant 1.13 (*)    All other components within normal limits  CBC  TROPONIN I (HIGH SENSITIVITY)  TROPONIN I (HIGH SENSITIVITY)    EKG EKG Interpretation  Date/Time:  Friday March 28 2019 17:27:01 EDT Ventricular Rate:  68 PR Interval:    QRS Duration: 99 QT Interval:  419 QTC Calculation: 446 R  Axis:   53 Text Interpretation: Sinus rhythm Probable left atrial enlargement RSR' in V1 or V2, right VCD or RVH No acute changes No significant change since last tracing Confirmed by Varney Biles [phone]) on 03/28/2019 5:48:34 PM   Radiology Dg Chest 2 View  Result Date: 03/28/2019 CLINICAL DATA:  Chest pain. EXAM: CHEST - 2 VIEW COMPARISON:  December 20, 2014 FINDINGS: The heart size and mediastinal contours are within normal limits. Both lungs are clear. The visualized skeletal structures are unremarkable. IMPRESSION: No active cardiopulmonary disease. Electronically Signed   By: Dorise Bullion III M.D   On: 03/28/2019 18:22   Ct Angio Chest Pe W And/or Wo Contrast  Result Date: 03/28/2019 CLINICAL DATA:  Chest pain EXAM: CT ANGIOGRAPHY CHEST WITH CONTRAST TECHNIQUE: Multidetector CT imaging of the chest was performed using the standard protocol during bolus administration of intravenous contrast. Multiplanar CT image reconstructions and MIPs were obtained to evaluate the vascular anatomy. CONTRAST:  74mL OMNIPAQUE IOHEXOL 350 MG/ML SOLN COMPARISON:  None. FINDINGS: Cardiovascular: There is no acute pulmonary embolism. The heart size is mildly enlarged. The ascending aorta is ectatic measuring approximately 4 cm. There is no significant pericardial effusion. Aortic calcifications are noted. Mediastinum/Nodes: --No mediastinal or hilar lymphadenopathy. --No axillary lymphadenopathy. --No supraclavicular lymphadenopathy. --Normal thyroid gland. --The esophagus is unremarkable Lungs/Pleura: There are mild emphysematous changes involving the upper lobes bilaterally. There are chronic appearing mild fibrotic changes throughout all lobes. Upper Abdomen: No acute abnormality. Musculoskeletal: No chest wall abnormality. No acute or significant osseous findings. Review of the MIP images confirms the above findings. IMPRESSION: 1. Negative  for acute pulmonary embolism. 2. Ectasia of the ascending aorta  measuring approximately 4 cm. Recommend annual imaging followup by CTA or MRA. This recommendation follows 2010 ACCF/AHA/AATS/ACR/ASA/SCA/SCAI/SIR/STS/SVM Guidelines for the Diagnosis and Management of Patients with Thoracic Aortic Disease. Circulation. 2010; [id number]. Aortic aneurysm NOS (ICD10-I71.9) Aortic Atherosclerosis (ICD10-I70.0) and Emphysema (ICD10-J43.9). Electronically Signed   By: Constance Holster M.D.   On: 03/28/2019 20:44    Procedures Procedures (including critical care time)  Medications Ordered in ED Medications  sodium chloride flush (NS) 0.9 % injection 3 mL (3 mLs Intravenous Given 03/28/19 1734)  morphine 4 MG/ML injection 4 mg (4 mg Intravenous Given 03/28/19 1852)  iohexol (OMNIPAQUE) 350 MG/ML injection 75 mL (75 mLs Intravenous Contrast Given 03/28/19 2026)     Initial Impression / Assessment and Plan / ED Course  I have reviewed the triage vital signs and the nursing notes.  Pertinent labs & imaging results that were available during my care of the patient were reviewed by me and considered in my medical decision making (see chart for details).  73 year old female who is here for constant right-sided/middle chest sharp pain since Friday, constant.  Worse with movement, palpation on exam and active use of the right arm.  She is right-hand dominant and sews pushing the machine.  Reports minimal shortness of breath that she describes that "it does not feel normal".  No infectious symptoms.  No pleuritic pain.  No exertional symptoms.  No issues with meals.  No distal loss of sensation or weakness.  No abdominal or severe back pain.  Highest on DDX is atypical chest pain, most likely MSK in etiology given right-sided reproducible chest wall tenderness and right-hand-dominant person.  Pain is much worse with active movement.  She has several cardiac risk factors including previous MI, moderate disease on LHC 2018, hypertension, hyperlipidemia, tobacco use and  age.  I considered ACS, unstable angina but symptoms are not very classic of these.  No exertional components, constant pain since Friday, worse with movement and palpation. No improvement with pain after nitro x3.  Her vital signs are normal and she has no risk factors for PE.  Her Wells criteria is low in my pretest suspicion is low for PE as well, given severity of her pain however we will add a D-dimer.  Clinically she has no infectious symptoms, abdominal or back pain, abdominal pulsatility.  I considered pneumonia, COVID-19, dissection very unlikely.  Her anxiety could be contributing.  EMR reviewed.  Last echo with normal EF.  LHC showing moderate disease in 2018.  She also had a normal stress test in 2018.  1925: Re-evaluated pt. Reports worse "severe" right sided chest pain worse than before.  Will give morphine, repeat EKG. Second trop pending.   2159: Patient reevaluated and no clinical decline reports significant improvement in chest pain after morphine.  D-dimer was elevated subsequent CTA obtained which was interpreted by me as well as radiology.  There is enlargement of a sending aorta minimally 4 cm.  Discussed with EDP who recommended CT surgery consult.  I spoke to Dr. Kipp Brood who did not recommend any emergent intervention and is appropriate for follow-up as needed for surveillance.  Discussed findings with patient and grandson.  Recommended follow-up with CT surgery/PCP.  Recommended Tylenol for pain, rest and oxycodone for breakthrough pain.  Return precautions discussed.  She has cardiology appointment on 11/1 she was encouraged to go to. Final Clinical Impressions(s) / ED Diagnoses   Final diagnoses:  Atypical chest  pain  Ectatic aorta [facility])    ED Discharge Orders         Ordered    oxyCODONE (OXY IR/ROXICODONE) 5 MG immediate release tablet  Every 6 hours PRN     03/28/19 2154           Kinnie Feil, PA-C 03/28/19 2201    Varney Biles, MD 03/28/19 2208

## 2019-03-31 ENCOUNTER — Encounter: Payer: Self-pay | Admitting: Cardiology

## 2019-03-31 ENCOUNTER — Encounter: Payer: Self-pay | Admitting: *Deleted

## 2019-03-31 ENCOUNTER — Ambulatory Visit (INDEPENDENT_AMBULATORY_CARE_PROVIDER_SITE_OTHER): Payer: BC Managed Care – PPO | Admitting: Cardiology

## 2019-03-31 ENCOUNTER — Other Ambulatory Visit: Payer: Self-pay

## 2019-03-31 VITALS — BP 138/84 | Ht 60.0 in | Wt 139.4 lb

## 2019-03-31 DIAGNOSIS — E781 Pure hyperglyceridemia: Secondary | ICD-10-CM | POA: Diagnosis not present

## 2019-03-31 DIAGNOSIS — I7121 Aneurysm of the ascending aorta, without rupture: Secondary | ICD-10-CM

## 2019-03-31 DIAGNOSIS — I25118 Atherosclerotic heart disease of native coronary artery with other forms of angina pectoris: Secondary | ICD-10-CM | POA: Diagnosis not present

## 2019-03-31 DIAGNOSIS — I712 Thoracic aortic aneurysm, without rupture: Secondary | ICD-10-CM

## 2019-03-31 DIAGNOSIS — E782 Mixed hyperlipidemia: Secondary | ICD-10-CM | POA: Diagnosis not present

## 2019-03-31 MED ORDER — ISOSORBIDE MONONITRATE ER 30 MG PO TB24: 30.0000 mg | ORAL_TABLET | Freq: Every day | ORAL | 1 refills | Status: DC

## 2019-03-31 NOTE — Progress Notes (Signed)
Cardiology Office Note:    Date:  03/31/2019   ID:  Tiffany Wright, DOB 1945/12/19, MRN [id number]  PCP:  [facility]  Cardiologist:  Lauree Chandler, MD  Electrophysiologist:  None   Referring MD: Leilani Able, F*   Follow up.  History of Present Illness:    Tiffany Wright is a 73 y.o. female with a hx of CAD s/p drug-eluting stent to the RCA in 2018 after ST elevation MI in the inferior wall, hyperlipidemia, hypertriglyceridemia and tobacco use.  While being treated for coronary artery disease she was switched from Brilinta to Plavix due to persistence of shortness of breath.  The patient has been doing well until recently she visited the emergency department due to intermittent chest pain.  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  More recently she has had intermittent chest tightness which is mid substernal and lasts for few minutes at a time prior to resolution.  She did have a ED visit on October 30th 2020 at which time she presented with chest pain had a work-up with CT angiogram which reported no underlying pulmonary embolism and aortic dissection.  However it did show 4 cm ascending thoracic aneurysm.    she was discharged home with follow-up.  Past Medical History:  Diagnosis Date   CAD (coronary artery disease)    a. 10/2016: inferior STEMI s/p DES to RCA   Hyperlipidemia    Tobacco abuse     Past Surgical History:  Procedure Laterality Date   CHOLECYSTECTOMY     CORONARY STENT INTERVENTION N/A 11/17/2016   Procedure: Coronary Stent Intervention;  Surgeon: Burnell Blanks, MD;  Location: [facility];  Service: Cardiovascular;  Laterality: N/A;   LEFT HEART CATH AND CORONARY ANGIOGRAPHY N/A 11/17/2016   Procedure: Left Heart Cath and Coronary Angiography;  Surgeon: Burnell Blanks, MD;  Location: [facility];  Service: Cardiovascular;   Laterality: N/A;    Current Medications: Current Meds  Medication Sig   aspirin 81 MG chewable tablet Chew 1 tablet (81 mg total) by mouth daily.   atorvastatin (LIPITOR) 80 MG tablet TAKE 1 TABLET DAILY AT 6PM   buPROPion (WELLBUTRIN SR) 100 MG 12 hr tablet Take 100 mg by mouth daily.   Cholecalciferol (VITAMIN D3) 5000 units TABS Take 5,000 Units by mouth at bedtime.    clopidogrel (PLAVIX) 75 MG tablet Take 1 tablet (75 mg total) by mouth daily.   hydrOXYzine (ATARAX/VISTARIL) 25 MG tablet Take 25 mg by mouth 3 (three) times daily.   loratadine (CLARITIN) 10 MG tablet Take 10 mg by mouth daily.    metoprolol tartrate (LOPRESSOR) 25 MG tablet Take 1 tablet (25 mg total) by mouth 2 (two) times daily.   mometasone (NASONEX) 50 MCG/ACT nasal spray Place 2 sprays into the nose daily as needed (allergies).   Multiple Vitamins-Minerals (CENTRUM SILVER PO) Take 1 tablet by mouth daily.   nitroGLYCERIN (NITROSTAT) 0.4 MG SL tablet PLACE 1 TABLET (0.4 MG TOTAL) UNDER THE TONGUE EVERY 5 (FIVE) MINUTES AS NEEDED FOR CHEST PAIN.   oxycodone (OXY-IR) 5 MG capsule Take 5 mg by mouth every 6 (six) hours as needed.   VASCEPA 1 g CAPS TAKE 2 CAPSULES (2 G TOTAL) BY MOUTH 2 (TWO) TIMES DAILY.     Allergies:   Patient has no known allergies.   Social History   Socioeconomic History  Marital status: Married    Spouse name: Not on file   Number of children: Not on file   Years of education: Not on file   Highest education level: Not on file  Occupational History   Not on file  Social Needs   Financial resource strain: Not on file   Food insecurity    Worry: Not on file    Inability: Not on file   Transportation needs    Medical: Not on file    Non-medical: Not on file  Tobacco Use   Smoking status: Former Smoker    Packs/day: 1.00    Years: 30.00    Pack years: 30.00    Types: Cigarettes    Quit date: 11/09/2016    Years since quitting: 2.3   Smokeless tobacco:  Never Used  Substance and Sexual Activity   Alcohol use: No   Drug use: No   Sexual activity: Not on file  Lifestyle   Physical activity    Days per week: Not on file    Minutes per session: Not on file   Stress: Not on file  Relationships   Social connections    Talks on phone: Not on file    Gets together: Not on file    Attends religious service: Not on file    Active member of club or organization: Not on file    Attends meetings of clubs or organizations: Not on file    Relationship status: Not on file  Other Topics Concern   Not on file  Social History Narrative   Not on file     Family History: The patient's family history includes Angina in her mother; Heart attack in her brother.  ROS:   Review of Systems  Constitution: Negative for decreased appetite, fever and weight gain.  HENT: Negative for congestion, ear discharge, hoarse voice and sore throat.   Eyes: Negative for discharge, redness, vision loss in right eye and visual halos.  Cardiovascular: Negative for chest pain, dyspnea on exertion, leg swelling, orthopnea and palpitations.  Respiratory: Negative for cough, hemoptysis, shortness of breath and snoring.   Endocrine: Negative for heat intolerance and polyphagia.  Hematologic/Lymphatic: Negative for bleeding problem. Does not bruise/bleed easily.  Skin: Negative for flushing, nail changes, rash and suspicious lesions.  Musculoskeletal: Negative for arthritis, joint pain, muscle cramps, myalgias, neck pain and stiffness.  Gastrointestinal: Negative for abdominal pain, bowel incontinence, diarrhea and excessive appetite.  Genitourinary: Negative for decreased libido, genital sores and incomplete emptying.  Neurological: Negative for brief paralysis, focal weakness, headaches and loss of balance.  Psychiatric/Behavioral: Negative for altered mental status, depression and suicidal ideas.  Allergic/Immunologic: Negative for HIV exposure and persistent  infections.    EKGs/Labs/Other Studies Reviewed:    The following studies were reviewed today:  EKG:  The ekg ordered today demonstrates sinus rhythm, heart rate 73 bpm RSR prime, similar to previous EKG done on 03/28/2019.  CTA IMPRESSION: 06/27/2018 1. Negative for acute pulmonary embolism. 2. Ectasia of the ascending aorta measuring approximately 4 cm. Recommend annual imaging followup by CTA or MRA. This recommendation follows 2010 ACCF/AHA/AATS/ACR/ASA/SCA/SCAI/SIR/STS/SVM Guidelines for the Diagnosis and Management of Patients with Thoracic Aortic Disease. Circulation. 2010; [id number]. Aortic aneurysm NOS (ICD10-I71.9) Aortic Atherosclerosis (ICD10-I70.0) and Emphysema (ICD10-J43.9).  Pharmacologic nuclear stress test:  Nuclear stress EF: 75%. The left ventricular ejection fraction is hyperdynamic (>65%).  The patient walked for a total of 5 minutes on a Bruce protocol treadmill test. Patient achieved a peak heart  rate of 141 which is 94% predicted maximal heart rate. There were no ST or T wave changes to suggest ischemia. The blood pressure response to exercise was normal.  The study is normal. No evidence of ischemia. There is no evidence of previous infarction.  This is a low risk study.  Thoracic echocardiogram June 2018 Study Conclusions: - Left ventricle: The cavity size was normal. Systolic function was   normal. The estimated ejection fraction was in the range of 55%   to 60%. Wall motion was normal; there were no regional wall   motion abnormalities. - Aortic valve: There was mild regurgitation. - Atrial septum: No defect or patent foramen ovale was identified.  Recent Labs: 03/28/2019: BUN 10; Creatinine, Ser 0.68; Hemoglobin 13.1; Platelets 264; Potassium 3.6; Sodium 138  Recent Lipid Panel    Component Value Date/Time   CHOL 190 11/18/2016 0030   TRIG 106 11/18/2016 0030   HDL 40 (L) 11/18/2016 0030   CHOLHDL 4.8 11/18/2016 0030   VLDL 21  11/18/2016 0030   LDLCALC 129 (H) 11/18/2016 0030    Physical Exam:    VS:  BP 138/84 (BP Location: Right Arm)    Ht 5' (1.524 m)    Wt 139 lb 6.4 oz (63.2 kg)    BMI 27.22 kg/m     Wt Readings from Last 3 Encounters:  03/31/19 139 lb 6.4 oz (63.2 kg)  03/28/19 137 lb (62.1 kg)  10/16/18 139 lb (63 kg)   GEN: Well nourished, well developed in no acute distress HEENT: Normal NECK: No JVD; No carotid bruits LYMPHATICS: No lymphadenopathy CARDIAC: S1S2 noted,RRR, no murmurs, rubs, gallops RESPIRATORY:  Clear to auscultation without rales, wheezing or rhonchi  ABDOMEN: Soft, non-tender, non-distended, +bowel sounds, no guarding. EXTREMITIES: No edema, No cyanosis, no clubbing MUSCULOSKELETAL:  No edema; No deformity  SKIN: Warm and dry NEUROLOGIC:  Alert and oriented x 3, non-focal PSYCHIATRIC:  Normal affect, good insight  ASSESSMENT:    1. Coronary artery disease of native artery of native heart with stable angina pectoris ([place])   2. Mixed hyperlipidemia   3. Ascending aortic aneurysm ([place])   4. Hypertriglyceridemia    PLAN:    1.  Her chest pain is atypical.  I reviewed the patient did have a nuclear stress test in May 2019 which was reported as normal.  At this time I will start patient on Imdur 10 mg daily to help with her symptoms.  And we will continue to monitor this patient.  If the Imdur does not help with symptoms will be reasonable to repeat her stress test to understand there is any evidence of ischemia.  For now we will continue aggressive medical therapy with aspirin 81 mg daily, Plavix 75 mg daily, atorvastatin 80 mg daily, she is currently on Vascepa of 1000 mg twice daily, metoprolol tartrate 25 mg twice daily.   2.  Hypertriglyceridemia continue patient on Vascepa 1000 mg twice daily.  Fasting lipid has been ordered-patient plans to come to get this done this week.  3.  Hyperlipidemia continue patient on Lipitor 80 mg daily.  Follow repeat lipid profile at which  time if needed Zetia 10 mg will be added to her regimen.  4. Her CT scan in the ED showed evidence of dilated thoracic aneurysm 4.0 centimeter.  We will get a transthoracic echocardiogram to assess the aortic root as well.  The patient is in agreement with the above plan. The patient left the office in stable condition.  The patient will follow up in 3 months.   Medication Adjustments/Labs and Tests Ordered: Current medicines are reviewed at length with the patient today.  Concerns regarding medicines are outlined above.  Orders Placed This Encounter  Procedures   Lipid Profile   EKG 12-Lead   ECHOCARDIOGRAM COMPLETE   Meds ordered this encounter  Medications   isosorbide mononitrate (IMDUR) 30 MG 24 hr tablet    Sig: Take 1 tablet (30 mg total) by mouth daily.    Dispense:  90 tablet    Refill:  1    Patient Instructions  Medication Instructions:  Your physician has recommended you make the following change in your medication:   START: IMDUR(isosorbide) 30 mg Take 1 tab daily  *If you need a refill on your cardiac medications before your next appointment, please call your pharmacy*  Lab Work: Your physician recommends that you return for lab work in:   Before next appointment: Lipid  If you have labs (blood work) drawn today and your tests are completely normal, you will receive your results only by:  [place] (if you have MyChart) OR  A paper copy in the mail If you have any lab test that is abnormal or we need to change your treatment, we will call you to review the results.  Testing/Procedures: Your physician has requested that you have an echocardiogram. Echocardiography is a painless test that uses sound waves to create images of your heart. It provides your doctor with information about the size and shape of your heart and how well your hearts chambers and valves are working. This procedure takes approximately one hour. There are no restrictions for this  procedure.    Follow-Up: At [facility], you and your health needs are our priority.  As part of our continuing mission to provide you with exceptional heart care, we have created designated Provider Care Teams.  These Care Teams include your primary Cardiologist (physician) and Advanced Practice Providers (APPs -  Physician Assistants and Nurse Practitioners) who all work together to provide you with the care you need, when you need it.  Your next appointment:   3 months  The format for your next appointment:   In Person  Provider:   Berniece Salines, DO  Other Instructions  Echocardiogram An echocardiogram is a procedure that uses painless sound waves (ultrasound) to produce an image of the heart. Images from an echocardiogram can provide important information about:  Signs of coronary artery disease (CAD).  Aneurysm detection. An aneurysm is a weak or damaged part of an artery wall that bulges out from the normal force of blood pumping through the body.  Heart size and shape. Changes in the size or shape of the heart can be associated with certain conditions, including heart failure, aneurysm, and CAD.  Heart muscle function.  Heart valve function.  Signs of a past heart attack.  Fluid buildup around the heart.  Thickening of the heart muscle.  A tumor or infectious growth around the heart valves. Tell a health care provider about:  Any allergies you have.  All medicines you are taking, including vitamins, herbs, eye drops, creams, and over-the-counter medicines.  Any blood disorders you have.  Any surgeries you have had.  Any medical conditions you have.  Whether you are pregnant or may be pregnant. What are the risks? Generally, this is a safe procedure. However, problems may occur, including:  Allergic reaction to dye (contrast) that may be used during the procedure. What happens  before the procedure? No specific preparation is needed. You may eat and drink  normally. What happens during the procedure?   An IV tube may be inserted into one of your veins.  You may receive contrast through this tube. A contrast is an injection that improves the quality of the pictures from your heart.  A gel will be applied to your chest.  A wand-like tool (transducer) will be moved over your chest. The gel will help to transmit the sound waves from the transducer.  The sound waves will harmlessly bounce off of your heart to allow the heart images to be captured in real-time motion. The images will be recorded on a computer. The procedure may vary among health care providers and hospitals. What happens after the procedure?  You may return to your normal, everyday life, including diet, activities, and medicines, unless your health care provider tells you not to do that. Summary  An echocardiogram is a procedure that uses painless sound waves (ultrasound) to produce an image of the heart.  Images from an echocardiogram can provide important information about the size and shape of your heart, heart muscle function, heart valve function, and fluid buildup around your heart.  You do not need to do anything to prepare before this procedure. You may eat and drink normally.  After the echocardiogram is completed, you may return to your normal, everyday life, unless your health care provider tells you not to do that. This information is not intended to replace advice given to you by your health care provider. Make sure you discuss any questions you have with your health care provider. Document Released: 05/12/2000 Document Revised: 09/05/2018 Document Reviewed: 06/17/2016 Elsevier Patient Education  2020 [employer].      Adopting a Healthy Lifestyle.  Know what a healthy weight is for you (roughly BMI <25) and aim to maintain this   Aim for 7+ servings of fruits and vegetables daily   65-80+ fluid ounces of water or unsweet tea for healthy kidneys     Limit to max 1 drink of alcohol per day; avoid smoking/tobacco   Limit animal fats in diet for cholesterol and heart health - choose grass fed whenever available   Avoid highly processed foods, and foods high in saturated/trans fats   Aim for low stress - take time to unwind and care for your mental health   Aim for 150 min of moderate intensity exercise weekly for heart health, and weights twice weekly for bone health   Aim for 7-9 hours of sleep daily   When it comes to diets, agreement about the perfect plan isnt easy to find, even among the experts. Experts at the [employer] developed an idea known as the Healthy Eating Plate. Just imagine a plate divided into logical, healthy portions.   The emphasis is on diet quality:   Load up on vegetables and fruits - one-half of your plate: Aim for color and variety, and remember that potatoes dont count.   Go for whole grains - one-quarter of your plate: Whole wheat, barley, wheat berries, quinoa, oats, brown rice, and foods made with them. If you want pasta, go with whole wheat pasta.   Protein power - one-quarter of your plate: Fish, chicken, beans, and nuts are all healthy, versatile protein sources. Limit red meat.   The diet, however, does go beyond the plate, offering a few other suggestions.   Use healthy plant oils, such as olive, canola, soy, corn,  sunflower and peanut. Check the labels, and avoid partially hydrogenated oil, which have unhealthy trans fats.   If youre thirsty, drink water. Coffee and tea are good in moderation, but skip sugary drinks and limit milk and dairy products to one or two daily servings.   The type of carbohydrate in the diet is more important than the amount. Some sources of carbohydrates, such as vegetables, fruits, whole grains, and beans-are healthier than others.   Finally, stay active  Signed, Berniece Salines, DO  03/31/2019 4:50 PM    [facility]

## 2019-03-31 NOTE — Patient Instructions (Signed)
Medication Instructions:  Your physician has recommended you make the following change in your medication:   START: IMDUR(isosorbide) 30 mg Take 1 tab daily  *If you need a refill on your cardiac medications before your next appointment, please call your pharmacy*  Lab Work: Your physician recommends that you return for lab work in:   Before next appointment: Lipid  If you have labs (blood work) drawn today and your tests are completely normal, you will receive your results only by: [place] MyChart Message (if you have MyChart) OR . A paper copy in the mail If you have any lab test that is abnormal or we need to change your treatment, we will call you to review the results.  Testing/Procedures: Your physician has requested that you have an echocardiogram. Echocardiography is a painless test that uses sound waves to create images of your heart. It provides your doctor with information about the size and shape of your heart and how well your heart's chambers and valves are working. This procedure takes approximately one hour. There are no restrictions for this procedure.    Follow-Up: At [facility], you and your health needs are our priority.  As part of our continuing mission to provide you with exceptional heart care, we have created designated Provider Care Teams.  These Care Teams include your primary Cardiologist (physician) and Advanced Practice Providers (APPs -  Physician Assistants and Nurse Practitioners) who all work together to provide you with the care you need, when you need it.  Your next appointment:   3 months  The format for your next appointment:   In Person  Provider:   Berniece Salines, DO  Other Instructions  Echocardiogram An echocardiogram is a procedure that uses painless sound waves (ultrasound) to produce an image of the heart. Images from an echocardiogram can provide important information about:  Signs of coronary artery disease (CAD).  Aneurysm detection.  An aneurysm is a weak or damaged part of an artery wall that bulges out from the normal force of blood pumping through the body.  Heart size and shape. Changes in the size or shape of the heart can be associated with certain conditions, including heart failure, aneurysm, and CAD.  Heart muscle function.  Heart valve function.  Signs of a past heart attack.  Fluid buildup around the heart.  Thickening of the heart muscle.  A tumor or infectious growth around the heart valves. Tell a health care provider about:  Any allergies you have.  All medicines you are taking, including vitamins, herbs, eye drops, creams, and over-the-counter medicines.  Any blood disorders you have.  Any surgeries you have had.  Any medical conditions you have.  Whether you are pregnant or may be pregnant. What are the risks? Generally, this is a safe procedure. However, problems may occur, including:  Allergic reaction to dye (contrast) that may be used during the procedure. What happens before the procedure? No specific preparation is needed. You may eat and drink normally. What happens during the procedure?   An IV tube may be inserted into one of your veins.  You may receive contrast through this tube. A contrast is an injection that improves the quality of the pictures from your heart.  A gel will be applied to your chest.  A wand-like tool (transducer) will be moved over your chest. The gel will help to transmit the sound waves from the transducer.  The sound waves will harmlessly bounce off of your heart to allow the  heart images to be captured in real-time motion. The images will be recorded on a computer. The procedure may vary among health care providers and hospitals. What happens after the procedure?  You may return to your normal, everyday life, including diet, activities, and medicines, unless your health care provider tells you not to do that. Summary  An echocardiogram is a  procedure that uses painless sound waves (ultrasound) to produce an image of the heart.  Images from an echocardiogram can provide important information about the size and shape of your heart, heart muscle function, heart valve function, and fluid buildup around your heart.  You do not need to do anything to prepare before this procedure. You may eat and drink normally.  After the echocardiogram is completed, you may return to your normal, everyday life, unless your health care provider tells you not to do that. This information is not intended to replace advice given to you by your health care provider. Make sure you discuss any questions you have with your health care provider. Document Released: 05/12/2000 Document Revised: 09/05/2018 Document Reviewed: 06/17/2016 Elsevier Patient Education  2020 [employer].

## 2019-04-02 DIAGNOSIS — S91332A Puncture wound without foreign body, left foot, initial encounter: Secondary | ICD-10-CM | POA: Diagnosis not present

## 2019-04-08 LAB — LIPID PANEL
Chol/HDL Ratio: 4.5 ratio — ABNORMAL HIGH (ref 0.0–4.4)
Cholesterol, Total: 130 mg/dL (ref 100–199)
HDL: 29 mg/dL — ABNORMAL LOW (ref >39)
LDL Chol Calc (NIH): 80 mg/dL (ref 0–99)
Triglycerides: 114 mg/dL (ref 0–149)
VLDL Cholesterol Cal: 21 mg/dL (ref 5–40)

## 2019-04-10 DIAGNOSIS — Z0000 Encounter for general adult medical examination without abnormal findings: Secondary | ICD-10-CM | POA: Diagnosis not present

## 2019-04-10 DIAGNOSIS — Z1331 Encounter for screening for depression: Secondary | ICD-10-CM | POA: Diagnosis not present

## 2019-04-10 DIAGNOSIS — F4321 Adjustment disorder with depressed mood: Secondary | ICD-10-CM | POA: Diagnosis not present

## 2019-04-10 DIAGNOSIS — I10 Essential (primary) hypertension: Secondary | ICD-10-CM | POA: Diagnosis not present

## 2019-04-10 DIAGNOSIS — I2510 Atherosclerotic heart disease of native coronary artery without angina pectoris: Secondary | ICD-10-CM | POA: Diagnosis not present

## 2019-05-15 ENCOUNTER — Other Ambulatory Visit: Payer: BC Managed Care – PPO

## 2019-06-07 ENCOUNTER — Other Ambulatory Visit: Payer: Self-pay | Admitting: Physician Assistant

## 2019-07-05 ENCOUNTER — Inpatient Hospital Stay (HOSPITAL_COMMUNITY)
Admission: AD | Admit: 2019-07-05 | Discharge: 2019-07-08 | DRG: 378 | Disposition: A | Discharge status: Home / Self Care | Payer: Medicare Other | Source: Other Acute Inpatient Hospital | Attending: Internal Medicine | Admitting: Internal Medicine

## 2019-07-05 DIAGNOSIS — Z955 Presence of coronary angioplasty implant and graft: Secondary | ICD-10-CM

## 2019-07-05 DIAGNOSIS — I10 Essential (primary) hypertension: Secondary | ICD-10-CM | POA: Diagnosis present

## 2019-07-05 DIAGNOSIS — Z7982 Long term (current) use of aspirin: Secondary | ICD-10-CM | POA: Diagnosis not present

## 2019-07-05 DIAGNOSIS — Z7902 Long term (current) use of antithrombotics/antiplatelets: Secondary | ICD-10-CM | POA: Diagnosis not present

## 2019-07-05 DIAGNOSIS — Z8249 Family history of ischemic heart disease and other diseases of the circulatory system: Secondary | ICD-10-CM

## 2019-07-05 DIAGNOSIS — I2510 Atherosclerotic heart disease of native coronary artery without angina pectoris: Secondary | ICD-10-CM | POA: Diagnosis present

## 2019-07-05 DIAGNOSIS — I252 Old myocardial infarction: Secondary | ICD-10-CM

## 2019-07-05 DIAGNOSIS — I25118 Atherosclerotic heart disease of native coronary artery with other forms of angina pectoris: Secondary | ICD-10-CM | POA: Diagnosis not present

## 2019-07-05 DIAGNOSIS — Z20822 Contact with and (suspected) exposure to covid-19: Secondary | ICD-10-CM | POA: Diagnosis present

## 2019-07-05 DIAGNOSIS — K921 Melena: Secondary | ICD-10-CM

## 2019-07-05 DIAGNOSIS — D62 Acute posthemorrhagic anemia: Secondary | ICD-10-CM | POA: Diagnosis present

## 2019-07-05 DIAGNOSIS — E785 Hyperlipidemia, unspecified: Secondary | ICD-10-CM | POA: Diagnosis present

## 2019-07-05 DIAGNOSIS — K2970 Gastritis, unspecified, without bleeding: Secondary | ICD-10-CM | POA: Diagnosis present

## 2019-07-05 DIAGNOSIS — K254 Chronic or unspecified gastric ulcer with hemorrhage: Principal | ICD-10-CM | POA: Diagnosis present

## 2019-07-05 DIAGNOSIS — R1013 Epigastric pain: Secondary | ICD-10-CM | POA: Diagnosis present

## 2019-07-05 HISTORY — DX: Acute myocardial infarction, unspecified: I21.9

## 2019-07-05 HISTORY — DX: Acute posthemorrhagic anemia: D62

## 2019-07-05 HISTORY — DX: Melena: K92.1

## 2019-07-05 LAB — PROTIME-INR
INR: 1.1 (ref 0.8–1.2)
Prothrombin Time: 13.6 seconds (ref 11.4–15.2)

## 2019-07-05 LAB — COMPREHENSIVE METABOLIC PANEL
ALT: 18 U/L (ref 0–44)
AST: 19 U/L (ref 15–41)
Albumin: 3.6 g/dL (ref 3.5–5.0)
Alkaline Phosphatase: 73 U/L (ref 38–126)
Anion gap: 10 (ref 5–15)
BUN: 11 mg/dL (ref 8–23)
CO2: 24 mmol/L (ref 22–32)
Calcium: 9.4 mg/dL (ref 8.9–10.3)
Chloride: 107 mmol/L (ref 98–111)
Creatinine, Ser: 0.65 mg/dL (ref 0.44–1.00)
GFR calc Af Amer: >60 mL/min (ref >60)
GFR calc non Af Amer: >60 mL/min (ref >60)
Glucose, Bld: 103 mg/dL — ABNORMAL HIGH (ref 70–99)
Potassium: 3.5 mmol/L (ref 3.5–5.1)
Sodium: 141 mmol/L (ref 135–145)
Total Bilirubin: 1 mg/dL (ref 0.3–1.2)
Total Protein: 5.8 g/dL — ABNORMAL LOW (ref 6.5–8.1)

## 2019-07-05 LAB — CBC WITH DIFFERENTIAL/PLATELET
Abs Immature Granulocytes: 0.02 10*3/uL (ref 0.00–0.07)
Basophils Absolute: 0.1 10*3/uL (ref 0.0–0.1)
Basophils Relative: 1 %
Eosinophils Absolute: 0.2 10*3/uL (ref 0.0–0.5)
Eosinophils Relative: 3 %
HCT: 24.9 % — ABNORMAL LOW (ref 36.0–46.0)
Hemoglobin: 8.1 g/dL — ABNORMAL LOW (ref 12.0–15.0)
Immature Granulocytes: 0 %
Lymphocytes Relative: 28 %
Lymphs Abs: 1.9 10*3/uL (ref 0.7–4.0)
MCH: 29.8 pg (ref 26.0–34.0)
MCHC: 32.5 g/dL (ref 30.0–36.0)
MCV: 91.5 fL (ref 80.0–100.0)
Monocytes Absolute: 0.5 10*3/uL (ref 0.1–1.0)
Monocytes Relative: 8 %
Neutro Abs: 4.1 10*3/uL (ref 1.7–7.7)
Neutrophils Relative %: 60 %
Platelets: 242 10*3/uL (ref 150–400)
RBC: 2.72 MIL/uL — ABNORMAL LOW (ref 3.87–5.11)
RDW: 12.6 % (ref 11.5–15.5)
WBC: 6.8 10*3/uL (ref 4.0–10.5)
nRBC: 0 % (ref 0.0–0.2)

## 2019-07-05 LAB — ABO/RH: ABO/RH(D): O NEG

## 2019-07-05 LAB — GLUCOSE, CAPILLARY: Glucose-Capillary: 149 mg/dL — ABNORMAL HIGH (ref 70–99)

## 2019-07-05 MED ORDER — ACETAMINOPHEN 650 MG RE SUPP: 650.0000 mg | Freq: Four times a day (QID) | RECTAL | Status: DC | PRN

## 2019-07-05 MED ORDER — ONDANSETRON HCL 4 MG/2ML IJ SOLN
4.0000 mg | Freq: Four times a day (QID) | INTRAMUSCULAR | Status: DC | PRN
  Administered 2019-07-06 (×2): 4 mg via INTRAVENOUS
  Filled 2019-07-05 (×2): qty 2

## 2019-07-05 MED ORDER — PANTOPRAZOLE SODIUM 40 MG IV SOLR
40.0000 mg | Freq: Two times a day (BID) | INTRAVENOUS | Status: DC
  Filled 2019-07-05: qty 40

## 2019-07-05 MED ORDER — SODIUM CHLORIDE 0.9 % IV SOLN: INTRAVENOUS | Status: DC

## 2019-07-05 MED ORDER — ACETAMINOPHEN 325 MG PO TABS: 650.0000 mg | ORAL_TABLET | Freq: Four times a day (QID) | ORAL | Status: DC | PRN

## 2019-07-05 MED ORDER — SODIUM CHLORIDE 0.9 % IV SOLN
8.0000 mg/h | INTRAVENOUS | Status: DC
  Administered 2019-07-05 – 2019-07-08 (×4): 8 mg/h via INTRAVENOUS
  Filled 2019-07-05 (×6): qty 80

## 2019-07-05 MED ORDER — ONDANSETRON HCL 4 MG PO TABS
4.0000 mg | ORAL_TABLET | Freq: Four times a day (QID) | ORAL | Status: DC | PRN
  Administered 2019-07-06: 4 mg via ORAL
  Filled 2019-07-05: qty 1

## 2019-07-05 NOTE — H&P (Signed)
History and Physical    Tiffany Wright [id number] DOB: Dec 26, 1945 DOA: 07/05/2019  PCP: [facility]  Patient coming from: Home  I have personally briefly reviewed patient's old medical records in [place]  Chief Complaint: N/V Abd pain  HPI: Tiffany Wright is a 74 y.o. female with medical history significant of STEMI in 2018 s/p DES to RCA, HLD.  Patient with N/V and epigastric abd pain for past 3 days.  No bright red blood but she didn't really look at vomit or stool.  Presented to ED at [facility] today.   ED Course: Found to have melanotic stool, hemoccult positive, HGB 8.4 down from 13 a few months ago.  CT abd pelvis neg (just shows aortic atherosclerosis).  No further hematemesis nor melena episodes while in ED today.  Pt transferred to [facility] for GI consult.  Review of Systems: As per HPI, otherwise all review of systems negative.  Past Medical History:  Diagnosis Date  . CAD (coronary artery disease)    a. 10/2016: inferior STEMI s/p DES to RCA  . Hyperlipidemia   . Tobacco abuse     Past Surgical History:  Procedure Laterality Date  . CHOLECYSTECTOMY    . CORONARY STENT INTERVENTION N/A 11/17/2016   Procedure: Coronary Stent Intervention;  Surgeon: Burnell Blanks, MD;  Location: [place] [facility];  Service: Cardiovascular;  Laterality: N/A;  . LEFT HEART CATH AND CORONARY ANGIOGRAPHY N/A 11/17/2016   Procedure: Left Heart Cath and Coronary Angiography;  Surgeon: Burnell Blanks, MD;  Location: [place] [facility];  Service: Cardiovascular;  Laterality: N/A;     reports that she quit smoking about 2 years ago. Her smoking use included cigarettes. She has a 30.00 pack-year smoking history. She has never used smokeless tobacco. She reports that she does not drink alcohol or use drugs.  No Known Allergies  Family History  Problem Relation Age of Onset  . Heart attack Brother   . Angina Mother      Prior to Admission medications     Medication Sig Start Date End Date Taking? Authorizing Provider  aspirin 81 MG chewable tablet Chew 1 tablet (81 mg total) by mouth daily. 11/20/16   Eileen Stanford, PA-C  atorvastatin (LIPITOR) 80 MG tablet TAKE 1 TABLET DAILY AT 6PM 12/05/18   Eileen Stanford, PA-C  buPROPion [facility] SR) 100 MG 12 hr tablet Take 100 mg by mouth daily.    [provider]  Cholecalciferol (VITAMIN D3) 5000 units TABS Take 5,000 Units by mouth at bedtime.     [provider]  clopidogrel (PLAVIX) 75 MG tablet Take 1 tablet (75 mg total) by mouth daily. 11/13/18   Imogene Burn, PA-C  hydrOXYzine (ATARAX/VISTARIL) 25 MG tablet Take 25 mg by mouth 3 (three) times daily.    [provider]  isosorbide mononitrate (IMDUR) 30 MG 24 hr tablet Take 1 tablet (30 mg total) by mouth daily. 03/31/19 06/29/19  Tobb, Kardie, DO  loratadine (CLARITIN) 10 MG tablet Take 10 mg by mouth daily.     [provider]  metoprolol tartrate (LOPRESSOR) 25 MG tablet TAKE 1 TABLET TWICE A DAY 06/09/19   Eileen Stanford, PA-C  mometasone (NASONEX) 50 MCG/ACT nasal spray Place 2 sprays into the nose daily as needed (allergies).    [provider]  Multiple Vitamins-Minerals (CENTRUM SILVER PO) Take 1 tablet by mouth daily.    [provider]  nitroGLYCERIN (NITROSTAT) 0.4 MG SL tablet  PLACE 1 TABLET (0.4 MG TOTAL) UNDER THE TONGUE EVERY 5 (FIVE) MINUTES AS NEEDED FOR CHEST PAIN. 01/02/19   Burnell Blanks, MD  oxycodone (OXY-IR) 5 MG capsule Take 5 mg by mouth every 6 (six) hours as needed.    [provider]  VASCEPA 1 g CAPS TAKE 2 CAPSULES (2 G TOTAL) BY MOUTH 2 (TWO) TIMES DAILY. 02/17/19   Imogene Burn, PA-C    Physical Exam: There were no vitals filed for this visit.  Constitutional: NAD, calm, comfortable Eyes: PERRL, lids and conjunctivae normal ENMT: Mucous membranes are moist. Posterior pharynx clear of any exudate or lesions.Normal dentition.   Neck: normal, supple, no masses, no thyromegaly Respiratory: clear to auscultation bilaterally, no wheezing, no crackles. Normal respiratory effort. No accessory muscle use.  Cardiovascular: Regular rate and rhythm, no murmurs / rubs / gallops. No extremity edema. 2+ pedal pulses. No carotid bruits.  Abdomen: no tenderness, no masses palpated. No hepatosplenomegaly. Bowel sounds positive.  Musculoskeletal: no clubbing / cyanosis. No joint deformity upper and lower extremities. Good ROM, no contractures. Normal muscle tone.  Skin: no rashes, lesions, ulcers. No induration Neurologic: CN 2-12 grossly intact. Sensation intact, DTR normal. Strength 5/5 in all 4.  Psychiatric: Normal judgment and insight. Alert and oriented x 3. Normal mood.    Labs on Admission: I have personally reviewed following labs and imaging studies  CBC: No results for input(s): WBC, NEUTROABS, HGB, HCT, MCV, PLT in the last 168 hours. Basic Metabolic Panel: No results for input(s): NA, K, CL, CO2, GLUCOSE, BUN, CREATININE, CALCIUM, MG, PHOS in the last 168 hours. GFR: CrCl cannot be calculated (Patient's most recent lab result is older than the maximum 21 days allowed.). Liver Function Tests: No results for input(s): AST, ALT, ALKPHOS, BILITOT, PROT, ALBUMIN in the last 168 hours. No results for input(s): LIPASE, AMYLASE in the last 168 hours. No results for input(s): AMMONIA in the last 168 hours. Coagulation Profile: No results for input(s): INR, PROTIME in the last 168 hours. Cardiac Enzymes: No results for input(s): CKTOTAL, CKMB, CKMBINDEX, TROPONINI in the last 168 hours. BNP (last 3 results) No results for input(s): PROBNP in the last 8760 hours. HbA1C: No results for input(s): HGBA1C in the last 72 hours. CBG: No results for input(s): GLUCAP in the last 168 hours. Lipid Profile: No results for input(s): CHOL, HDL, LDLCALC, TRIG, CHOLHDL, LDLDIRECT in the last 72 hours. Thyroid Function Tests: No  results for input(s): TSH, T4TOTAL, FREET4, T3FREE, THYROIDAB in the last 72 hours. Anemia Panel: No results for input(s): VITAMINB12, FOLATE, FERRITIN, TIBC, IRON, RETICCTPCT in the last 72 hours. Urine analysis: No results found for: COLORURINE, APPEARANCEUR, LABSPEC, PHURINE, GLUCOSEU, HGBUR, BILIRUBINUR, KETONESUR, PROTEINUR, UROBILINOGEN, NITRITE, LEUKOCYTESUR  Radiological Exams on Admission: No results found.  EKG: Independently reviewed.  Assessment/Plan Principal Problem:   Gastrointestinal hemorrhage with melena Active Problems:   CAD (coronary artery disease)   Acute blood loss anemia    1. UGIB with melena and acute blood loss anemia - 1. No stigmata of active bleed while in ED today 2. Type and screen 3. Repeating labs now, and again in AM 4. Transfuse if needed 5. Clear liquids for now, NPO after MN 6. NS at 75 for the moment 7. Tele monitor 8. PPI gtt 9. Call GI in AM (sooner if she re-develops bleed or becomes unstable tonight) for likely EGD 2. CAD - 1. Holding ASA/Plavix 2. Remainder of med-rec still pending  DVT prophylaxis: SCDs Code Status: Full  Family Communication: No family in room Disposition Plan: Home after admit Consults called: None, call GI in AM Admission status: Admit to inpatient  Severity of Illness: The appropriate patient status for this patient is INPATIENT. Inpatient status is judged to be reasonable and necessary in order to provide the required intensity of service to ensure the patient's safety. The patient's presenting symptoms, physical exam findings, and initial radiographic and laboratory data in the context of their chronic comorbidities is felt to place them at high risk for further clinical deterioration. Furthermore, it is not anticipated that the patient will be medically stable for discharge from the hospital within 2 midnights of admission. The following factors support the patient status of inpatient.   IP status due to  UGIB with melena and acute blood loss anemia, HGB dropped from 13 to 8.4.  * I certify that at the point of admission it is my clinical judgment that the patient will require inpatient hospital care spanning beyond 2 midnights from the point of admission due to high intensity of service, high risk for further deterioration and high frequency of surveillance required.*    Jaydee Ingman M. DO [facility]  How to contact the [facility] Attending or Consulting provider [place] or covering provider during after hours [place], for this patient?  1. Check the care team in [facility] and look for a) attending/consulting [facility] provider listed and b) the [facility] team listed 2. Log into [email or web address]  Amion Physician Scheduling and messaging for groups and whole hospitals  On call and physician scheduling software for group practices, residents, hospitalists and other medical providers for call, clinic, rotation and shift schedules. OnCall Enterprise is a hospital-wide system for scheduling doctors and paging doctors on call. EasyPlot is for scientific plotting and data analysis.  [email or web address]  and use Door's universal password to access. If you do not have the password, please contact the hospital operator.  3. Locate the [facility] provider you are looking for under [facility] and page to a number that you can be directly reached. 4. If you still have difficulty reaching the provider, please page the [facility] (Director on Call) for the Hospitalists listed on amion for assistance.  07/05/2019, 7:57 PM

## 2019-07-06 LAB — BASIC METABOLIC PANEL
Anion gap: 7 (ref 5–15)
BUN: 9 mg/dL (ref 8–23)
CO2: 23 mmol/L (ref 22–32)
Calcium: 8.9 mg/dL (ref 8.9–10.3)
Chloride: 109 mmol/L (ref 98–111)
Creatinine, Ser: 0.67 mg/dL (ref 0.44–1.00)
GFR calc Af Amer: >60 mL/min (ref >60)
GFR calc non Af Amer: >60 mL/min (ref >60)
Glucose, Bld: 98 mg/dL (ref 70–99)
Potassium: 3.2 mmol/L — ABNORMAL LOW (ref 3.5–5.1)
Sodium: 139 mmol/L (ref 135–145)

## 2019-07-06 LAB — CBC
HCT: 22.1 % — ABNORMAL LOW (ref 36.0–46.0)
Hemoglobin: 7.1 g/dL — ABNORMAL LOW (ref 12.0–15.0)
MCH: 29.6 pg (ref 26.0–34.0)
MCHC: 32.1 g/dL (ref 30.0–36.0)
MCV: 92.1 fL (ref 80.0–100.0)
Platelets: 230 10*3/uL (ref 150–400)
RBC: 2.4 MIL/uL — ABNORMAL LOW (ref 3.87–5.11)
RDW: 12.6 % (ref 11.5–15.5)
WBC: 7.1 10*3/uL (ref 4.0–10.5)
nRBC: 0 % (ref 0.0–0.2)

## 2019-07-06 LAB — HEMOGLOBIN AND HEMATOCRIT, BLOOD
HCT: 27.5 % — ABNORMAL LOW (ref 36.0–46.0)
Hemoglobin: 9.2 g/dL — ABNORMAL LOW (ref 12.0–15.0)

## 2019-07-06 LAB — SARS CORONAVIRUS 2 (TAT 6-24 HRS): SARS Coronavirus 2: NEGATIVE

## 2019-07-06 LAB — PREPARE RBC (CROSSMATCH)

## 2019-07-06 MED ORDER — VITAMIN D 25 MCG (1000 UNIT) PO TABS
5000.0000 [IU] | ORAL_TABLET | Freq: Every day | ORAL | Status: DC
  Administered 2019-07-06 – 2019-07-07 (×2): 5000 [IU] via ORAL
  Filled 2019-07-06 (×2): qty 5

## 2019-07-06 MED ORDER — METOPROLOL TARTRATE 25 MG PO TABS
25.0000 mg | ORAL_TABLET | Freq: Two times a day (BID) | ORAL | Status: DC
  Administered 2019-07-07 – 2019-07-08 (×2): 25 mg via ORAL
  Filled 2019-07-06 (×3): qty 1

## 2019-07-06 MED ORDER — FUROSEMIDE 10 MG/ML IJ SOLN
40.0000 mg | Freq: Once | INTRAMUSCULAR | Status: AC
  Administered 2019-07-06: 40 mg via INTRAVENOUS
  Filled 2019-07-06: qty 4

## 2019-07-06 MED ORDER — DIPHENHYDRAMINE HCL 50 MG/ML IJ SOLN: 25.0000 mg | Freq: Four times a day (QID) | INTRAMUSCULAR | Status: DC | PRN

## 2019-07-06 MED ORDER — POTASSIUM CHLORIDE 10 MEQ/100ML IV SOLN
10.0000 meq | INTRAVENOUS | Status: AC
  Administered 2019-07-06 (×2): 10 meq via INTRAVENOUS
  Filled 2019-07-06 (×2): qty 100

## 2019-07-06 MED ORDER — SODIUM CHLORIDE 0.9% IV SOLUTION: Freq: Once | INTRAVENOUS | Status: DC

## 2019-07-06 MED ORDER — ISOSORBIDE MONONITRATE ER 30 MG PO TB24
30.0000 mg | ORAL_TABLET | Freq: Every day | ORAL | Status: DC
  Administered 2019-07-06 – 2019-07-08 (×3): 30 mg via ORAL
  Filled 2019-07-06 (×3): qty 1

## 2019-07-06 MED ORDER — ATORVASTATIN CALCIUM 80 MG PO TABS
80.0000 mg | ORAL_TABLET | Freq: Every day | ORAL | Status: DC
  Administered 2019-07-06 – 2019-07-07 (×2): 80 mg via ORAL
  Filled 2019-07-06 (×2): qty 1

## 2019-07-06 MED ORDER — NITROGLYCERIN 0.4 MG SL SUBL: 0.4000 mg | SUBLINGUAL_TABLET | SUBLINGUAL | Status: DC | PRN

## 2019-07-06 MED ORDER — BUPROPION HCL ER (XL) 150 MG PO TB24
150.0000 mg | ORAL_TABLET | Freq: Every day | ORAL | Status: DC
  Administered 2019-07-06 – 2019-07-08 (×3): 150 mg via ORAL
  Filled 2019-07-06 (×3): qty 1

## 2019-07-06 NOTE — Progress Notes (Signed)
PROGRESS NOTE                                                                                                                                                                                                             Patient Demographics:    Tiffany Wright, is a 74 y.o. female, DOB - May 05, 1946, SN:[id number]  Outpatient Primary MD for the patient is [facility] [place] - 1  Admit date - 07/05/2019    Chief complaint. Weakness.     Brief Narrative - Tiffany Wright is a 74 y.o. female with medical history significant of STEMI in 2018 s/p DES to RCA, HLD. Patient presented to the ER with multiple episodes of nausea vomiting and then feeling weak, she did not look at her stool or her emesis and did not know the color of it. When she arrived in the ER she was found to have a hemoglobin of close to 8 down from a baseline of 13.8, she was admitted for further care.   Subjective:    Tiffany Wright today has, No headache, No chest pain, generalized abdominal discomfort, currently no nausea, no shortness of breath.   Assessment  & Plan :    1. Acute anemia with generalized weakness due to GI bleed, unclear if upper or lower, she did have nausea vomiting and multiple episodes of emesis however BUN is normal, she was also on dual antiplatelet therapy prior to admission, also past history of colonic polyp found at [facility] by Dr. Melina Copa about 10 years ago. At this time she is on IV PPI, GI discomfort almost resolved, 2 units of packed RBC transfusion on 07/06/2019, GI to see. Currently n.p.o. except meds.  2. CAD with drug-eluting stent more than 2 years ago. Was on dual antiplatelet therapy which will be held. Beta-blocker and statin will be resumed. Beta-blocker once blood pressure stabilizes.  3. Hypertension. Currently beta-blocker on hold, 2 units of packed RBC transfusion due to blood pressures being low from GI  bleed.  4. Dyslipidemia. On statin.      Condition - Fair  Family Communication  :  Husband 07/06/19  Code Status :  Full  Diet :   Diet Order            Diet NPO time specified  Diet effective midnight  Diet clear liquid Room service appropriate? Yes; Fluid consistency: Thin  Diet effective now               Disposition Plan  : Stay in the hospital, getting work-up done for acute GI bleed with severe anemia  Consults  : GI  Procedures  :    PUD Prophylaxis : PPI  DVT Prophylaxis  :    SCDs   Lab Results  Component Value Date   PLT 230 07/06/2019    Inpatient Medications  Scheduled Meds: . sodium chloride   Intravenous Once  . furosemide  40 mg Intravenous Once  . [START ON 07/09/2019] pantoprazole  40 mg Intravenous Q12H   Continuous Infusions: . sodium chloride 75 mL/hr at 07/05/19 2227  . pantoprozole (PROTONIX) infusion 8 mg/hr (07/05/19 2222)  . potassium chloride 10 mEq (07/06/19 1059)   PRN Meds:.acetaminophen **OR** acetaminophen, diphenhydrAMINE, ondansetron **OR** ondansetron (ZOFRAN) IV  Antibiotics  :    Anti-infectives (From admission, onward)   None       Time Spent in minutes  30   Tiffany Wright M.D on 07/06/2019 at 11:17 AM  To page go to [email or web address] - password [facility]  [facility] -  Office  [phone]  See all Orders from today for further details    Objective:   Vitals:   07/05/19 2330 07/06/19 0530 07/06/19 0919 07/06/19 1100  BP:  119/66 117/76 (!) 97/55  Pulse:  73 75 77  Resp:  17 18 16   Temp:  97.7 F (36.5 C) 98 F (36.7 C) 98.4 F (36.9 C)  TempSrc:  Oral Oral Oral  SpO2:  99% 100% 98%  Weight: 65.9 kg       Wt Readings from Last 3 Encounters:  07/05/19 65.9 kg  03/31/19 63.2 kg  03/28/19 62.1 kg     Intake/Output Summary (Last 24 hours) at 07/06/2019 1117 Last data filed at 07/06/2019 0300 Gross per 24 hour  Intake 755.62 ml  Output --  Net 755.62 ml     Physical Exam  Awake  Alert,   No new F.N deficits, Normal affect [place].AT,PERRAL Supple Neck,No JVD, No cervical lymphadenopathy appriciated.  Symmetrical Chest wall movement, Good air movement bilaterally, CTAB RRR,No Gallops,Rubs or new Murmurs, No Parasternal Heave +ve B.Sounds, Abd Soft, No tenderness, No organomegaly appriciated, No rebound - guarding or rigidity. No Cyanosis, Clubbing or edema, No new Rash or bruise      Data Review:    CBC Recent Labs  Lab 07/05/19 1949 07/06/19 0336  WBC 6.8 7.1  HGB 8.1* 7.1*  HCT 24.9* 22.1*  PLT 242 230  MCV 91.5 92.1  MCH 29.8 29.6  MCHC 32.5 32.1  RDW 12.6 12.6  LYMPHSABS 1.9  --   MONOABS 0.5  --   EOSABS 0.2  --   BASOSABS 0.1  --     Chemistries  Recent Labs  Lab 07/05/19 1949 07/06/19 0336  NA 141 139  K 3.5 3.2*  CL 107 109  CO2 24 23  GLUCOSE 103* 98  BUN 11 9  CREATININE 0.65 0.67  CALCIUM 9.4 8.9  AST 19  --   ALT 18  --   ALKPHOS 73  --   BILITOT 1.0  --    ------------------------------------------------------------------------------------------------------------------ No results for input(s): CHOL, HDL, LDLCALC, TRIG, CHOLHDL, LDLDIRECT in the last 72 hours.  No results found for: HGBA1C ------------------------------------------------------------------------------------------------------------------ No results for input(s): TSH, T4TOTAL, T3FREE, THYROIDAB in the last 72 hours.  Invalid input(s): FREET3  Cardiac Enzymes No results for input(s): CKMB, TROPONINI, MYOGLOBIN in the last 168 hours.  Invalid input(s): CK ------------------------------------------------------------------------------------------------------------------ No results found for: BNP  Micro Results Recent Results (from the past 240 hour(s))  SARS CORONAVIRUS 2 (TAT 6-24 HRS) Nasopharyngeal Nasopharyngeal Swab     Status: None   Collection Time: 07/05/19  7:00 PM   Specimen: Nasopharyngeal Swab  Result Value Ref Range Status   SARS Coronavirus  2 NEGATIVE NEGATIVE Final    Comment: (NOTE) SARS-CoV-2 target nucleic acids are NOT DETECTED. The SARS-CoV-2 RNA is generally detectable in upper and lower respiratory specimens during the acute phase of infection. Negative results do not preclude SARS-CoV-2 infection, do not rule out co-infections with other pathogens, and should not be used as the sole basis for treatment or other patient management decisions. Negative results must be combined with clinical observations, patient history, and epidemiological information. The expected result is Negative. Fact Sheet for Patients: SugarRoll.be Fact Sheet for Healthcare Providers: https://www.woods-mathews.com/ This test is not yet approved or cleared by the [place] FDA and  has been authorized for detection and/or diagnosis of SARS-CoV-2 by FDA under an Emergency Use Authorization (EUA). This EUA will remain  in effect (meaning this test can be used) for the duration of the COVID-19 declaration under Section 56 4(b)(1) of the Act, 21 U.S.C. section 360bbb-3(b)(1), unless the authorization is terminated or revoked sooner. Performed at [facility], [place] [address] [place],  [address]     Radiology Reports No results found.

## 2019-07-06 NOTE — Consult Note (Signed)
Referring Provider: Dr. Candiss Norse Primary Care Physician:  [facility] Primary Gastroenterologist:  Althia Forts  Reason for Consultation:  Melena  HPI: Tiffany Wright is a 74 y.o. female with CAD and previous coronary stent placement on ASA/Plavix transferred from [facility] for report of melena on rectal in ER. Patient denies seeing black or red stools at home and has been having vomiting described as brown and yellow vomitus for 2 days. Denies any black or red vomitus. +Nausea. Denies associated abdominal pain, heartburn, dysphagia, or weight loss. Chronically takes NSAIDs almost daily. Personal history of colon polyps last approx 6 years ago in [place]. Hgb 7.1 (13.1 in Oct 2020). Denies any known history of ulcers or previous endoscopy. No bleeding or BMs since transfer.  Past Medical History:  Diagnosis Date  . CAD (coronary artery disease)    a. 10/2016: inferior STEMI s/p DES to RCA  . Hyperlipidemia   . Tobacco abuse     Past Surgical History:  Procedure Laterality Date  . CHOLECYSTECTOMY    . CORONARY STENT INTERVENTION N/A 11/17/2016   Procedure: Coronary Stent Intervention;  Surgeon: Burnell Blanks, MD;  Location: [place] [facility];  Service: Cardiovascular;  Laterality: N/A;  . LEFT HEART CATH AND CORONARY ANGIOGRAPHY N/A 11/17/2016   Procedure: Left Heart Cath and Coronary Angiography;  Surgeon: Burnell Blanks, MD;  Location: [place] [facility];  Service: Cardiovascular;  Laterality: N/A;    Prior to Admission medications   Medication Sig Start Date End Date Taking? Authorizing Provider  aspirin 81 MG chewable tablet Chew 1 tablet (81 mg total) by mouth daily. 11/20/16  Yes Eileen Stanford, PA-C  atorvastatin (LIPITOR) 80 MG tablet TAKE 1 TABLET DAILY AT 6PM Patient taking differently: Take 80 mg by mouth daily at 6 PM. TAKE 1 TABLET DAILY AT 6PM 12/05/18  Yes Eileen Stanford, PA-C  buPROPion (WELLBUTRIN XL) 150 MG 24 hr tablet Take 150 mg by  mouth daily. 07/04/19  Yes [provider]  Cholecalciferol (VITAMIN D3) 5000 units TABS Take 5,000 Units by mouth at bedtime.    Yes [provider]  clopidogrel (PLAVIX) 75 MG tablet Take 1 tablet (75 mg total) by mouth daily. 11/13/18  Yes Imogene Burn, PA-C  isosorbide mononitrate (IMDUR) 30 MG 24 hr tablet Take 1 tablet (30 mg total) by mouth daily. 03/31/19 07/05/19 Yes Tobb, Kardie, DO  loratadine (CLARITIN) 10 MG tablet Take 10 mg by mouth daily.    Yes [provider]  metoprolol tartrate (LOPRESSOR) 25 MG tablet TAKE 1 TABLET TWICE A DAY Patient taking differently: Take 25 mg by mouth 2 (two) times daily.  06/09/19  Yes Eileen Stanford, PA-C  mometasone (NASONEX) 50 MCG/ACT nasal spray Place 2 sprays into the nose daily as needed (allergies).   Yes [provider]  Multiple Vitamins-Minerals (CENTRUM SILVER PO) Take 1 tablet by mouth daily.   Yes [provider]  nitrofurantoin, macrocrystal-monohydrate, (MACROBID) 100 MG capsule Take 100 mg by mouth 2 (two) times daily. 07/03/19  Yes [provider]  nitroGLYCERIN (NITROSTAT) 0.4 MG SL tablet PLACE 1 TABLET (0.4 MG TOTAL) UNDER THE TONGUE EVERY 5 (FIVE) MINUTES AS NEEDED FOR CHEST PAIN. 01/02/19   Burnell Blanks, MD    Scheduled Meds: . sodium chloride   Intravenous Once  . furosemide  40 mg Intravenous Once  . [START ON 07/09/2019] pantoprazole  40 mg Intravenous Q12H   Continuous Infusions: . sodium chloride 75 mL/hr at 07/05/19 2227  .  pantoprozole (PROTONIX) infusion 8 mg/hr (07/05/19 2222)  . potassium chloride     PRN Meds:.acetaminophen **OR** acetaminophen, diphenhydrAMINE, ondansetron **OR** ondansetron (ZOFRAN) IV  Allergies as of 07/05/2019  . (No Known Allergies)    Family History  Problem Relation Age of Onset  . Heart attack Brother   . Angina Mother     Social History   Socioeconomic History  . Marital status: Married    Spouse name: Not on  file  . Number of children: Not on file  . Years of education: Not on file  . Highest education level: Not on file  Occupational History  . Not on file  Tobacco Use  . Smoking status: Former Smoker    Packs/day: 1.00    Years: 30.00    Pack years: 30.00    Types: Cigarettes    Quit date: 11/09/2016    Years since quitting: 2.6  . Smokeless tobacco: Never Used  Substance and Sexual Activity  . Alcohol use: No  . Drug use: No  . Sexual activity: Not on file  Other Topics Concern  . Not on file  Social History Narrative  . Not on file   Social Determinants of Health   Financial Resource Strain:   . Difficulty of Paying Living Expenses: Not on file  Food Insecurity:   . Worried About Charity fundraiser in the Last Year: Not on file  . Ran Out of Food in the Last Year: Not on file  Transportation Needs:   . Lack of Transportation (Medical): Not on file  . Lack of Transportation (Non-Medical): Not on file  Physical Activity:   . Days of Exercise per Week: Not on file  . Minutes of Exercise per Session: Not on file  Stress:   . Feeling of Stress : Not on file  Social Connections:   . Frequency of Communication with Friends and Family: Not on file  . Frequency of Social Gatherings with Friends and Family: Not on file  . Attends Religious Services: Not on file  . Active Member of Clubs or Organizations: Not on file  . Attends Archivist Meetings: Not on file  . Marital Status: Not on file  Intimate Partner Violence:   . Fear of Current or Ex-Partner: Not on file  . Emotionally Abused: Not on file  . Physically Abused: Not on file  . Sexually Abused: Not on file    Review of Systems: All negative except as stated above in HPI.  Physical Exam: Vital signs: Vitals:   07/06/19 0530 07/06/19 0919  BP: 119/66 117/76  Pulse: 73 75  Resp: 17 18  Temp: 97.7 F (36.5 C) 98 F (36.7 C)  SpO2: 99% 100%   Last BM Date: (PTA) General:   Lethargic, thin, elderly,  no acute distress Head: normocephalic, atraumatic Eyes: anicteric sclera ENT: oropharynx clear, poor dentition Neck: supple, nontender Lungs:  Clear throughout to auscultation.   No wheezes, crackles, or rhonchi. No acute distress. Heart:  Regular rate and rhythm; no murmurs, clicks, rubs,  or gallops. Abdomen: soft, nontender, nondistended, +BS  Rectal:  Deferred Ext: no edema  GI:  Lab Results: Recent Labs    07/05/19 1949 07/06/19 0336  WBC 6.8 7.1  HGB 8.1* 7.1*  HCT 24.9* 22.1*  PLT 242 230   BMET Recent Labs    07/05/19 1949 07/06/19 0336  NA 141 139  K 3.5 3.2*  CL 107 109  CO2 24 23  GLUCOSE 103* 98  BUN 11 9  CREATININE 0.65 0.67  CALCIUM 9.4 8.9   LFT Recent Labs    07/05/19 1949  PROT 5.8*  ALBUMIN 3.6  AST 19  ALT 18  ALKPHOS 73  BILITOT 1.0   PT/INR Recent Labs    07/05/19 1949  LABPROT 13.6  INR 1.1     Studies/Results: No results found.  Impression/Plan: 74 yo on ASA/Plavix with melenic stools in [place] ER with Hgb 7.1. On Protonix drip. No signs of ongoing bleeding but would recommend an EGD tomorrow to check for peptic ulcer disease with chronic NSAID use. Clear liquid diet. NPO p MN.    LOS: 1 day   Lear Ng  07/06/2019, 9:27 AM  Questions please call [phone]

## 2019-07-06 NOTE — Plan of Care (Signed)

## 2019-07-07 ENCOUNTER — Inpatient Hospital Stay (HOSPITAL_COMMUNITY): Payer: Medicare Other | Admitting: Anesthesiology

## 2019-07-07 ENCOUNTER — Encounter (HOSPITAL_COMMUNITY): Payer: Self-pay | Admitting: Internal Medicine

## 2019-07-07 ENCOUNTER — Encounter (HOSPITAL_COMMUNITY)
Admission: AD | Disposition: A | Discharge status: Home / Self Care | Payer: Self-pay | Source: Other Acute Inpatient Hospital | Attending: Internal Medicine

## 2019-07-07 HISTORY — PX: ESOPHAGOGASTRODUODENOSCOPY (EGD) WITH PROPOFOL: SHX5813

## 2019-07-07 HISTORY — PX: BIOPSY: SHX5522

## 2019-07-07 LAB — CBC
HCT: 26.1 % — ABNORMAL LOW (ref 36.0–46.0)
Hemoglobin: 8.5 g/dL — ABNORMAL LOW (ref 12.0–15.0)
MCH: 29.8 pg (ref 26.0–34.0)
MCHC: 32.6 g/dL (ref 30.0–36.0)
MCV: 91.6 fL (ref 80.0–100.0)
Platelets: 235 10*3/uL (ref 150–400)
RBC: 2.85 MIL/uL — ABNORMAL LOW (ref 3.87–5.11)
RDW: 13.8 % (ref 11.5–15.5)
WBC: 6.9 10*3/uL (ref 4.0–10.5)
nRBC: 0 % (ref 0.0–0.2)

## 2019-07-07 SURGERY — ESOPHAGOGASTRODUODENOSCOPY (EGD) WITH PROPOFOL
Anesthesia: Monitor Anesthesia Care

## 2019-07-07 MED ORDER — PROPOFOL 500 MG/50ML IV EMUL
INTRAVENOUS | Status: DC | PRN
  Administered 2019-07-07: 100 ug/kg/min via INTRAVENOUS

## 2019-07-07 MED ORDER — PROPOFOL 10 MG/ML IV BOLUS
INTRAVENOUS | Status: DC | PRN
  Administered 2019-07-07: 20 mg via INTRAVENOUS

## 2019-07-07 MED ORDER — LIDOCAINE HCL (CARDIAC) PF 100 MG/5ML IV SOSY
PREFILLED_SYRINGE | INTRAVENOUS | Status: DC | PRN
  Administered 2019-07-07: 60 mg via INTRATRACHEAL

## 2019-07-07 MED ORDER — KCL-LACTATED RINGERS-D5W 20 MEQ/L IV SOLN
INTRAVENOUS | Status: AC
  Filled 2019-07-07 (×2): qty 1000

## 2019-07-07 MED ORDER — ALBUTEROL SULFATE (2.5 MG/3ML) 0.083% IN NEBU: 3.0000 mL | INHALATION_SOLUTION | Freq: Four times a day (QID) | RESPIRATORY_TRACT | Status: DC | PRN

## 2019-07-07 MED ORDER — PROMETHAZINE HCL 25 MG/ML IJ SOLN
12.5000 mg | Freq: Once | INTRAMUSCULAR | Status: AC
  Administered 2019-07-07: 12.5 mg via INTRAVENOUS
  Filled 2019-07-07: qty 1

## 2019-07-07 MED ORDER — SODIUM CHLORIDE 0.9 % IV SOLN: INTRAVENOUS | Status: DC

## 2019-07-07 MED ORDER — SODIUM CHLORIDE 0.9 % IV SOLN: INTRAVENOUS | Status: DC | PRN

## 2019-07-07 SURGICAL SUPPLY — 15 items

## 2019-07-07 NOTE — Op Note (Addendum)
[facility] Patient Name: Tiffany Wright Procedure Date : 07/07/2019 MRN: [id number] Attending MD: Juanita Craver , MD Date of Birth: [birth year] CSN: [id number] Age: 74 Admit Type: Inpatient Procedure:                EGD with antral biopsies. Indications:              Iron deficiency anemia, Melena. Providers:                Juanita Craver, MD, Jeanella Cara, RN, Lazaro Arms, Technician, Corie Chiquito, Technician, Haze Boyden, CRNA Referring MD:             Margaree Mackintosh. Candiss Norse, MD Medicines:                Monitored Anesthesia Care Complications:            No immediate complications. Estimated Blood Loss:     Estimated blood loss was minimal. Procedure:                Pre-Anesthesia Assessment: - Prior to the                            procedure, a history and physical was performed,                            and patient medications and allergies were                            reviewed. The patient's tolerance of previous                            anesthesia was also reviewed. The risks and                            benefits of the procedure and the sedation options                            and risks were discussed with the patient. All                            questions were answered, and informed consent was                            obtained. Prior Anticoagulants: The patient has                            taken Plavix (Clopidogrel) & Aspirin, last dose was                            3 days prior to procedure. ASA Grade Assessment:  III - A patient with severe systemic disease. After                            reviewing the risks and benefits, the patient was                            deemed in satisfactory condition to undergo the                            procedure. After obtaining informed consent, the                            endoscope was passed under direct vision.                        Throughout the procedure, the patient's blood                            pressure, pulse, and oxygen saturations were                            monitored continuously. The GIF-H190 ([id number])                            Olympus gastroscope was introduced through the                            mouth, and advanced to the second part of duodenum.                            The EGD was accomplished without difficulty. The                            patient tolerated the procedure well. Scope In: Scope Out: Findings:      The examined esophagus and the GEJ appeared widely patent and normal;       SCJ was measured at 34 cm.      Patchy moderate inflammation characterized by erosions, erythema,       friability and granularity was found in the gastric antrum.      Three non-bleeding cratered gastric ulcers, measuring 4 mm, 6-7 mm and 8       mm were found in the prepyloric region of the stomach were edematous       mucosa around the base of the ulcers; biopsies were done for H. pylori;       there was a small pigmented spot in on eof the ulcers and there was no       old of fresh hem noted in the upper GI tract. There was a small       pigmented spot in one of the ulcers.      The cardia and gastric fundus were normal on retroflexion.      The examined duodenum was normal. Impression:               - Normal appearing, widely patent esophagus and  GEJ; Z-line measured at 34 cm.                           - Patchy moderate antral gastritis.                           - Three non-bleeding gastric ulcers in the                            pre-pyloric region with a pigmented spot in one of                            the ulcers-biopsies don efor H. pylori by                            pathoogy-Forrest IIc.                           - Normal examined duodenum. Moderate Sedation:      MAC used. Recommendation:           - Start with a full liquid diet today  and advance                            as tolerated.                           - Continue present medications as discussed with                            Dr. Lala Lund.                           - Continue PPI's on an OP basis.                           - Avoid the use of OTC NSAIDS.                           - Await pathology results.                           - Return to my office in 2 weeks. Procedure Code(s):        --- Professional ---                           [phone], Esophagogastroduodenoscopy, flexible,                            transoral; with biopsy, single or multiple Diagnosis Code(s):        --- Professional ---                           D50.9, Iron deficiency anemia, unspecified                           K92.1, Melena (includes Hematochezia)  K25.9, Gastric ulcer, unspecified as acute or                            chronic, without hemorrhage or perforation                           K29.70, Gastritis, unspecified, without bleeding CPT copyright 2019 American Medical Association. All rights reserved. The codes documented in this report are preliminary and upon coder review may  be revised to meet current compliance requirements. Juanita Craver, MD Juanita Craver, MD 07/07/2019 2:05:19 PM This report has been signed electronically. Number of Addenda: 0

## 2019-07-07 NOTE — H&P (View-Only) (Signed)
PROGRESS NOTE                                                                                                                                                                                                             Patient Demographics:    [name], is a 74 y.o. female, DOB - [birth year], [id number]  Outpatient Primary [facility] for the patient is [facility] [place] date - 07/05/2019    Chief complaint. Weakness.     Brief Narrative - Tiffany Wright is a 74 y.o. female with medical history significant of STEMI in 2018 s/p DES to RCA, HLD. Patient presented to the ER with multiple episodes of nausea vomiting and then feeling weak, she did not look at her stool or her emesis and did not know the color of it. When she arrived in the ER she was found to have a hemoglobin of close to 8 down from a baseline of 13.8, she was admitted for further care.   Subjective:    Patient in bed, appears comfortable, denies any headache, no fever, no chest pain or pressure, no shortness of breath , no abdominal pain. No focal weakness.  No blood in stool or black-colored stool, feels weak and hungry but no other complaints.    Assessment  & Plan :    1. Acute anemia with generalized weakness due to GI bleed, unclear if upper or lower, she did have nausea vomiting and multiple episodes of emesis however BUN is normal, she was also on dual antiplatelet therapy prior to admission, also past history of colonic polyp found at [facility] by Dr. Melina Copa about 10 years ago.  She has been transfused with 2 units of packed RBCs on 07/06/2019, posttransfusion H&H appears to have stabilized, continue IV PPI, GI on board likely will require EGD and if inconclusive she will require colonoscopy, on clears.  2. CAD with drug-eluting stent more than 2 years ago. Was on dual antiplatelet therapy which will be held. Beta-blocker statin and  Imdur continued.  3. Hypertension.  Blood pressure stabilized will start low-dose beta-blocker along with Imdur.  4. Dyslipidemia. On statin.      Condition - Fair  Family Communication  :  Husband 07/06/19  Code Status :  Full  Diet :   Diet  Order            Diet NPO time specified  Diet effective midnight               Disposition Plan  : Stay in the hospital, getting work-up done for acute GI bleed with severe anemia  Consults  : GI  Procedures  :    PUD Prophylaxis : PPI  DVT Prophylaxis  :    SCDs   Lab Results  Component Value Date   PLT 235 07/07/2019    Inpatient Medications  Scheduled Meds: . sodium chloride   Intravenous Once  . atorvastatin  80 mg Oral q1800  . buPROPion  150 mg Oral Daily  . cholecalciferol  5,000 Units Oral QHS  . isosorbide mononitrate  30 mg Oral Daily  . metoprolol tartrate  25 mg Oral BID  . [START ON 07/09/2019] pantoprazole  40 mg Intravenous Q12H   Continuous Infusions: . dextrose 5% lactated ringers with KCl 20 mEq/L    . pantoprozole (PROTONIX) infusion 8 mg/hr (07/07/19 0700)   PRN Meds:.acetaminophen **OR** acetaminophen, albuterol, diphenhydrAMINE, nitroGLYCERIN, ondansetron **OR** ondansetron (ZOFRAN) IV  Antibiotics  :    Anti-infectives (From admission, onward)   None       Time Spent in minutes  30   Lala Lund M.D on 07/07/2019 at 9:42 AM  To page go to [email or web address] - password [facility]  [facility] -  Office  [phone]  See all Orders from today for further details    Objective:   Vitals:   07/06/19 1623 07/06/19 2100 07/07/19 0526 07/07/19 0915  BP: 103/65 108/63 140/71 131/68  Pulse:  84 90 77  Resp:  18 18 20   Temp:  98.5 F (36.9 C) 98.2 F (36.8 C) 98.7 F (37.1 C)  TempSrc:    Oral  SpO2:  96% 100% 99%  Weight:  65.9 kg      Wt Readings from Last 3 Encounters:  07/06/19 65.9 kg  03/31/19 63.2 kg  03/28/19 62.1 kg     Intake/Output Summary (Last 24 hours) at  07/07/2019 0942 Last data filed at 07/07/2019 0700 Gross per 24 hour  Intake 3142.04 ml  Output 2350 ml  Net 792.04 ml     Physical Exam  Awake Alert,  No new F.N deficits, Normal affect [place].AT,PERRAL Supple Neck,No JVD, No cervical lymphadenopathy appriciated.  Symmetrical Chest wall movement, Good air movement bilaterally, no Rales or wheezes RRR,No Gallops, Rubs or new Murmurs, No Parasternal Heave +ve B.Sounds, Abd Soft, No tenderness, No organomegaly appriciated, No rebound - guarding or rigidity. No Cyanosis, Clubbing or edema, No new Rash or bruise    Data Review:    CBC Recent Labs  Lab 07/05/19 1949 07/06/19 0336 07/06/19 1639 07/07/19 0829  WBC 6.8 7.1  --  6.9  HGB 8.1* 7.1* 9.2* 8.5*  HCT 24.9* 22.1* 27.5* 26.1*  PLT 242 230  --  235  MCV 91.5 92.1  --  91.6  MCH 29.8 29.6  --  29.8  MCHC 32.5 32.1  --  32.6  RDW 12.6 12.6  --  13.8  LYMPHSABS 1.9  --   --   --   MONOABS 0.5  --   --   --   EOSABS 0.2  --   --   --   BASOSABS 0.1  --   --   --     Chemistries  Recent Labs  Lab 07/05/19 1949 07/06/19 0336  NA 141 139  K 3.5 3.2*  CL 107 109  CO2 24 23  GLUCOSE 103* 98  BUN 11 9  CREATININE 0.65 0.67  CALCIUM 9.4 8.9  AST 19  --   ALT 18  --   ALKPHOS 73  --   BILITOT 1.0  --    ------------------------------------------------------------------------------------------------------------------ No results for input(s): CHOL, HDL, LDLCALC, TRIG, CHOLHDL, LDLDIRECT in the last 72 hours.  No results found for: HGBA1C ------------------------------------------------------------------------------------------------------------------ No results for input(s): TSH, T4TOTAL, T3FREE, THYROIDAB in the last 72 hours.  Invalid input(s): FREET3  Cardiac Enzymes No results for input(s): CKMB, TROPONINI, MYOGLOBIN in the last 168 hours.  Invalid input(s):  CK ------------------------------------------------------------------------------------------------------------------ No results found for: BNP  Micro Results Recent Results (from the past 240 hour(s))  SARS CORONAVIRUS 2 (TAT 6-24 HRS) Nasopharyngeal Nasopharyngeal Swab     Status: None   Collection Time: 07/05/19  7:00 PM   Specimen: Nasopharyngeal Swab  Result Value Ref Range Status   SARS Coronavirus 2 NEGATIVE NEGATIVE Final    Comment: (NOTE) SARS-CoV-2 target nucleic acids are NOT DETECTED. The SARS-CoV-2 RNA is generally detectable in upper and lower respiratory specimens during the acute phase of infection. Negative results do not preclude SARS-CoV-2 infection, do not rule out co-infections with other pathogens, and should not be used as the sole basis for treatment or other patient management decisions. Negative results must be combined with clinical observations, patient history, and epidemiological information. The expected result is Negative. Fact Sheet for Patients: SugarRoll.be Fact Sheet for Healthcare Providers: https://www.woods-mathews.com/ This test is not yet approved or cleared by the [place] FDA and  has been authorized for detection and/or diagnosis of SARS-CoV-2 by FDA under an Emergency Use Authorization (EUA). This EUA will remain  in effect (meaning this test can be used) for the duration of the COVID-19 declaration under Section 56 4(b)(1) of the Act, 21 U.S.C. section 360bbb-3(b)(1), unless the authorization is terminated or revoked sooner. Performed at [facility], [place] [address] [place], [place] [address]     Radiology Reports No results found.

## 2019-07-07 NOTE — Evaluation (Signed)
Physical Therapy Evaluation Patient Details Name: Tiffany Wright MRN: [id number] DOB: 1945/08/30 Today's Date: 07/07/2019   History of Present Illness  74 yo female with onset of upper GI bleed and transfusion was referred to PT.  Her active anemia and weakness are limiting energy but is walking with no AD.  PMHx:  DM, HTN, STEMI, CAD with drug eluting stents, HLD  Clinical Impression  Pt was seen for mobility of gait using her IV pole, but is able to steady herself with focus on balance.  Pt is expecting to go home with husband and will work with PT on endurance and safety of obstacle clearance on the hall in her room.  Follow up with her on controlling navigation of hosp hallway and transfer this care to home with HHPT to ck on her.    Follow Up Recommendations Home health PT;Supervision for mobility/OOB    Equipment Recommendations  None recommended by PT    Recommendations for Other Services       Precautions / Restrictions Precautions Precautions: Fall Precaution Comments: telemetry Restrictions Weight Bearing Restrictions: No      Mobility  Bed Mobility Overal bed mobility: Needs Assistance Bed Mobility: Supine to Sit;Sit to Supine     Supine to sit: Min assist Sit to supine: Min assist      Transfers Overall transfer level: Needs assistance Equipment used: Rolling walker (2 wheeled);1 person hand held assist Transfers: Sit to/from Stand Sit to Stand: Min assist         General transfer comment: pt requires cues for hand placement but is controlled for balance  Ambulation/Gait Ambulation/Gait assistance: Min guard Gait Distance (Feet): 150 Feet Assistive device: 1 person hand held assist Gait Pattern/deviations: Step-through pattern;Decreased stride length;Wide base of support Gait velocity: reduced Gait velocity interpretation: <1.31 ft/sec, indicative of household ambulator    Financial trader Rankin (Stroke  Patients Only)       Balance Overall balance assessment: Needs assistance Sitting-balance support: Feet supported Sitting balance-Leahy Scale: Good     Standing balance support: Single extremity supported Standing balance-Leahy Scale: Fair                               Pertinent Vitals/Pain Pain Assessment: No/denies pain    Home Living Family/patient expects to be discharged to:: Private residence Living Arrangements: Spouse/significant other Available Help at Discharge: Family;Available 24 hours/day Type of Home: House Home Access: Level entry     Home Layout: One level Home Equipment: Walker - 2 wheels;Cane - single point Additional Comments: has equipment from family     Prior Function Level of Independence: Independent               Hand Dominance   Dominant Hand: Right    Extremity/Trunk Assessment   Upper Extremity Assessment Upper Extremity Assessment: Overall WFL for tasks assessed    Lower Extremity Assessment Lower Extremity Assessment: Generalized weakness    Cervical / Trunk Assessment Cervical / Trunk Assessment: Kyphotic  Communication   Communication: No difficulties  Cognition Arousal/Alertness: Awake/alert Behavior During Therapy: WFL for tasks assessed/performed Overall Cognitive Status: Within Functional Limits for tasks assessed  General Comments General comments (skin integrity, edema, etc.): Pt was assisted to use BSC and then walked with IV pole for assist    Exercises     Assessment/Plan    PT Assessment Patient needs continued PT services  PT Problem List Decreased strength;Decreased range of motion;Decreased activity tolerance;Decreased balance;Decreased mobility;Decreased coordination;Decreased knowledge of use of DME;Decreased safety awareness       PT Treatment Interventions DME instruction;Gait training;Functional mobility training;Therapeutic  activities;Therapeutic exercise;Balance training;Neuromuscular re-education;Patient/family education    PT Goals (Current goals can be found in the Care Plan section)  Acute Rehab PT Goals Patient Stated Goal: to go home wiht husband and feel stronger PT Goal Formulation: With patient Time For Goal Achievement: 07/14/19 Potential to Achieve Goals: Good    Frequency Min 3X/week   Barriers to discharge   anemia of illness    Co-evaluation               AM-PAC PT "6 Clicks" Mobility  Outcome Measure Help needed turning from your back to your side while in a flat bed without using bedrails?: None Help needed moving from lying on your back to sitting on the side of a flat bed without using bedrails?: None Help needed moving to and from a bed to a chair (including a wheelchair)?: A Little Help needed standing up from a chair using your arms (e.g., wheelchair or bedside chair)?: A Little Help needed to walk in hospital room?: A Little Help needed climbing 3-5 steps with a railing? : A Lot 6 Click Score: 19    End of Session Equipment Utilized During Treatment: Gait belt Activity Tolerance: Patient limited by fatigue Patient left: in bed;with call bell/phone within reach;with bed alarm set Nurse Communication: Mobility status PT Visit Diagnosis: Unsteadiness on feet (R26.81);Muscle weakness (generalized) (M62.81);Difficulty in walking, not elsewhere classified (R26.2)    Time: 1027-1050 PT Time Calculation (min) (ACUTE ONLY): 23 min   Charges:   PT Evaluation $PT Eval Moderate Complexity: 1 Mod PT Treatments $Gait Training: 8-22 mins        Ramond Dial 07/07/2019, 10:24 PM  Mee Hives, PT MS Acute Rehab Dept. Number: [place]

## 2019-07-07 NOTE — Progress Notes (Signed)
PROGRESS NOTE                                                                                                                                                                                                             Patient Demographics:    Tiffany Wright, is a 74 y.o. female, DOB - May 22, 1946, SN:[id number]  Outpatient Primary MD for the patient is [facility] [place] date - 07/05/2019    Chief complaint. Weakness.     Brief Narrative - Tiffany Wright is a 74 y.o. female with medical history significant of STEMI in 2018 s/p DES to RCA, HLD. Patient presented to the ER with multiple episodes of nausea vomiting and then feeling weak, she did not look at her stool or her emesis and did not know the color of it. When she arrived in the ER she was found to have a hemoglobin of close to 8 down from a baseline of 13.8, she was admitted for further care.   Subjective:    Patient in bed, appears comfortable, denies any headache, no fever, no chest pain or pressure, no shortness of breath , no abdominal pain. No focal weakness.  No blood in stool or black-colored stool, feels weak and hungry but no other complaints.    Assessment  & Plan :    1. Acute anemia with generalized weakness due to GI bleed, unclear if upper or lower, she did have nausea vomiting and multiple episodes of emesis however BUN is normal, she was also on dual antiplatelet therapy prior to admission, also past history of colonic polyp found at [facility] by Dr. Melina Copa about 10 years ago.  She has been transfused with 2 units of packed RBCs on 07/06/2019, posttransfusion H&H appears to have stabilized, continue IV PPI, GI on board likely will require EGD and if inconclusive she will require colonoscopy, on clears.  2. CAD with drug-eluting stent more than 2 years ago. Was on dual antiplatelet therapy which will be held. Beta-blocker statin and  Imdur continued.  3. Hypertension.  Blood pressure stabilized will start low-dose beta-blocker along with Imdur.  4. Dyslipidemia. On statin.      Condition - Fair  Family Communication  :  Husband 07/06/19  Code Status :  Full  Diet :   Diet  Order            Diet NPO time specified  Diet effective midnight               Disposition Plan  : Stay in the hospital, getting work-up done for acute GI bleed with severe anemia  Consults  : GI  Procedures  :    PUD Prophylaxis : PPI  DVT Prophylaxis  :    SCDs   Lab Results  Component Value Date   PLT 235 07/07/2019    Inpatient Medications  Scheduled Meds: . sodium chloride   Intravenous Once  . atorvastatin  80 mg Oral q1800  . buPROPion  150 mg Oral Daily  . cholecalciferol  5,000 Units Oral QHS  . isosorbide mononitrate  30 mg Oral Daily  . metoprolol tartrate  25 mg Oral BID  . [START ON 07/09/2019] pantoprazole  40 mg Intravenous Q12H   Continuous Infusions: . dextrose 5% lactated ringers with KCl 20 mEq/L    . pantoprozole (PROTONIX) infusion 8 mg/hr (07/07/19 0700)   PRN Meds:.acetaminophen **OR** acetaminophen, albuterol, diphenhydrAMINE, nitroGLYCERIN, ondansetron **OR** ondansetron (ZOFRAN) IV  Antibiotics  :    Anti-infectives (From admission, onward)   None       Time Spent in minutes  30   Lala Lund M.D on 07/07/2019 at 9:42 AM  To page go to [email or web address] - password [facility]  [facility] -  Office  [phone]  See all Orders from today for further details    Objective:   Vitals:   07/06/19 1623 07/06/19 2100 07/07/19 0526 07/07/19 0915  BP: 103/65 108/63 140/71 131/68  Pulse:  84 90 77  Resp:  18 18 20   Temp:  98.5 F (36.9 C) 98.2 F (36.8 C) 98.7 F (37.1 C)  TempSrc:    Oral  SpO2:  96% 100% 99%  Weight:  65.9 kg      Wt Readings from Last 3 Encounters:  07/06/19 65.9 kg  03/31/19 63.2 kg  03/28/19 62.1 kg     Intake/Output Summary (Last 24 hours) at  07/07/2019 0942 Last data filed at 07/07/2019 0700 Gross per 24 hour  Intake 3142.04 ml  Output 2350 ml  Net 792.04 ml     Physical Exam  Awake Alert,  No new F.N deficits, Normal affect [place].AT,PERRAL Supple Neck,No JVD, No cervical lymphadenopathy appriciated.  Symmetrical Chest wall movement, Good air movement bilaterally, no Rales or wheezes RRR,No Gallops, Rubs or new Murmurs, No Parasternal Heave +ve B.Sounds, Abd Soft, No tenderness, No organomegaly appriciated, No rebound - guarding or rigidity. No Cyanosis, Clubbing or edema, No new Rash or bruise    Data Review:    CBC Recent Labs  Lab 07/05/19 1949 07/06/19 0336 07/06/19 1639 07/07/19 0829  WBC 6.8 7.1  --  6.9  HGB 8.1* 7.1* 9.2* 8.5*  HCT 24.9* 22.1* 27.5* 26.1*  PLT 242 230  --  235  MCV 91.5 92.1  --  91.6  MCH 29.8 29.6  --  29.8  MCHC 32.5 32.1  --  32.6  RDW 12.6 12.6  --  13.8  LYMPHSABS 1.9  --   --   --   MONOABS 0.5  --   --   --   EOSABS 0.2  --   --   --   BASOSABS 0.1  --   --   --     Chemistries  Recent Labs  Lab 07/05/19 1949 07/06/19 0336  NA 141 139  K 3.5 3.2*  CL 107 109  CO2 24 23  GLUCOSE 103* 98  BUN 11 9  CREATININE 0.65 0.67  CALCIUM 9.4 8.9  AST 19  --   ALT 18  --   ALKPHOS 73  --   BILITOT 1.0  --    ------------------------------------------------------------------------------------------------------------------ No results for input(s): CHOL, HDL, LDLCALC, TRIG, CHOLHDL, LDLDIRECT in the last 72 hours.  No results found for: HGBA1C ------------------------------------------------------------------------------------------------------------------ No results for input(s): TSH, T4TOTAL, T3FREE, THYROIDAB in the last 72 hours.  Invalid input(s): FREET3  Cardiac Enzymes No results for input(s): CKMB, TROPONINI, MYOGLOBIN in the last 168 hours.  Invalid input(s):  CK ------------------------------------------------------------------------------------------------------------------ No results found for: BNP  Micro Results Recent Results (from the past 240 hour(s))  SARS CORONAVIRUS 2 (TAT 6-24 HRS) Nasopharyngeal Nasopharyngeal Swab     Status: None   Collection Time: 07/05/19  7:00 PM   Specimen: Nasopharyngeal Swab  Result Value Ref Range Status   SARS Coronavirus 2 NEGATIVE NEGATIVE Final    Comment: (NOTE) SARS-CoV-2 target nucleic acids are NOT DETECTED. The SARS-CoV-2 RNA is generally detectable in upper and lower respiratory specimens during the acute phase of infection. Negative results do not preclude SARS-CoV-2 infection, do not rule out co-infections with other pathogens, and should not be used as the sole basis for treatment or other patient management decisions. Negative results must be combined with clinical observations, patient history, and epidemiological information. The expected result is Negative. Fact Sheet for Patients: SugarRoll.be Fact Sheet for Healthcare Providers: https://www.woods-mathews.com/ This test is not yet approved or cleared by the [place] FDA and  has been authorized for detection and/or diagnosis of SARS-CoV-2 by FDA under an Emergency Use Authorization (EUA). This EUA will remain  in effect (meaning this test can be used) for the duration of the COVID-19 declaration under Section 56 4(b)(1) of the Act, 21 U.S.C. section 360bbb-3(b)(1), unless the authorization is terminated or revoked sooner. Performed at [facility], [place] [address], [place], [place] [address]     Radiology Reports No results found.

## 2019-07-07 NOTE — Anesthesia Procedure Notes (Signed)
Procedure Name: MAC Date/Time: 07/07/2019 1:17 PM Performed by: Kathryne Hitch, CRNA Pre-anesthesia Checklist: Patient identified, Emergency Drugs available, Suction available and Patient being monitored Patient Re-evaluated:Patient Re-evaluated prior to induction Oxygen Delivery Method: Nasal cannula Preoxygenation: Pre-oxygenation with 100% oxygen Induction Type: IV induction Dental Injury: Teeth and Oropharynx as per pre-operative assessment

## 2019-07-07 NOTE — Anesthesia Postprocedure Evaluation (Signed)
Anesthesia Post Note  Patient: Tiffany Wright  Procedure(s) Performed: ESOPHAGOGASTRODUODENOSCOPY (EGD) WITH PROPOFOL (N/A ) BIOPSY     Patient location during evaluation: PACU Anesthesia Type: MAC Level of consciousness: awake and alert Pain management: pain level controlled Vital Signs Assessment: post-procedure vital signs reviewed and stable Respiratory status: spontaneous breathing, nonlabored ventilation, respiratory function stable and patient connected to nasal cannula oxygen Cardiovascular status: stable and blood pressure returned to baseline Postop Assessment: no apparent nausea or vomiting Anesthetic complications: no    Last Vitals:  Vitals:   07/07/19 1350 07/07/19 1400  BP: 128/61 (!) 139/58  Pulse: 91 82  Resp: 19 18  Temp:    SpO2: 100% 100%    Last Pain:  Vitals:   07/07/19 1338  TempSrc: Oral  PainSc: 0-No pain                 Rea Kalama S

## 2019-07-07 NOTE — Transfer of Care (Signed)
Immediate Anesthesia Transfer of Care Note  Patient: Tiffany Wright  Procedure(s) Performed: ESOPHAGOGASTRODUODENOSCOPY (EGD) WITH PROPOFOL (N/A ) BIOPSY  Patient Location: Endoscopy Unit  Anesthesia Type:MAC  Level of Consciousness: awake, alert  and patient cooperative  Airway & Oxygen Therapy: Patient Spontanous Breathing and Patient connected to nasal cannula oxygen  Post-op Assessment: Report given to RN and Post -op Vital signs reviewed and stable  Post vital signs: Reviewed and stable  Last Vitals:  Vitals Value Taken Time  BP    Temp    Pulse 99 07/07/19 1337  Resp 17 07/07/19 1337  SpO2 100 % 07/07/19 1337  Vitals shown include unvalidated device data.  Last Pain:  Vitals:   07/07/19 1214  TempSrc: Temporal  PainSc: 0-No pain      Patients Stated Pain Goal: 1 (42/68/34 1962)  Complications: No apparent anesthesia complications

## 2019-07-07 NOTE — Plan of Care (Signed)
  Problem: Clinical Measurements: Goal: Diagnostic test results will improve Outcome: Progressing   Problem: Activity: Goal: Risk for activity intolerance will decrease Outcome: Progressing   

## 2019-07-07 NOTE — Progress Notes (Signed)
Pt c/o nausea and it is too soon for Zofran. Pt also states that it does not help much. RN messaged on call NP to see if we can get an order for something else. Awaiting response.   Eleanora Neighbor, RN

## 2019-07-07 NOTE — Interval H&P Note (Signed)
History and Physical Interval Note:  07/07/2019 1:01 PM  Tiffany Wright  has presented today for surgery, with the diagnosis of Melena; Nausea and Vomiting.  The various methods of treatment have been discussed with the patient and family. After consideration of risks, benefits and other options for treatment, the patient has consented to  Procedure(s): ESOPHAGOGASTRODUODENOSCOPY (EGD) WITH PROPOFOL (N/A) as a surgical intervention.  The patient's history has been reviewed, patient examined, no change in status, stable for surgery.  I have reviewed the patient's chart and labs.  Questions were answered to the patient's satisfaction.     Juanita Craver

## 2019-07-07 NOTE — Anesthesia Preprocedure Evaluation (Signed)
Anesthesia Evaluation  Patient identified by MRN, date of birth, ID band Patient awake    Reviewed: Allergy & Precautions, NPO status , Patient's Chart, lab work & pertinent test results  Airway Mallampati: II  TM Distance: >3 FB Neck ROM: Full    Dental no notable dental hx.    Pulmonary neg pulmonary ROS, former smoker,    Pulmonary exam normal breath sounds clear to auscultation       Cardiovascular + CAD, + Past MI and + Cardiac Stents  Normal cardiovascular exam Rhythm:Regular Rate:Normal     Neuro/Psych negative neurological ROS  negative psych ROS   GI/Hepatic negative GI ROS, Neg liver ROS,   Endo/Other  negative endocrine ROS  Renal/GU negative Renal ROS  negative genitourinary   Musculoskeletal negative musculoskeletal ROS (+)   Abdominal   Peds negative pediatric ROS (+)  Hematology  (+) anemia ,   Anesthesia Other Findings   Reproductive/Obstetrics negative OB ROS                             Anesthesia Physical Anesthesia Plan  ASA: III  Anesthesia Plan: MAC   Post-op Pain Management:    Induction: Intravenous  PONV Risk Score and Plan: 0  Airway Management Planned: Simple Face Mask  Additional Equipment:   Intra-op Plan:   Post-operative Plan:   Informed Consent: I have reviewed the patients History and Physical, chart, labs and discussed the procedure including the risks, benefits and alternatives for the proposed anesthesia with the patient or authorized representative who has indicated his/her understanding and acceptance.     Dental advisory given  Plan Discussed with: CRNA and Surgeon  Anesthesia Plan Comments:         Anesthesia Quick Evaluation

## 2019-07-08 ENCOUNTER — Other Ambulatory Visit: Payer: Self-pay | Admitting: Physician Assistant

## 2019-07-08 LAB — CBC WITH DIFFERENTIAL/PLATELET
Abs Immature Granulocytes: 0.02 10*3/uL (ref 0.00–0.07)
Basophils Absolute: 0.1 10*3/uL (ref 0.0–0.1)
Basophils Relative: 1 %
Eosinophils Absolute: 0.2 10*3/uL (ref 0.0–0.5)
Eosinophils Relative: 3 %
HCT: 25 % — ABNORMAL LOW (ref 36.0–46.0)
Hemoglobin: 8.2 g/dL — ABNORMAL LOW (ref 12.0–15.0)
Immature Granulocytes: 0 %
Lymphocytes Relative: 30 %
Lymphs Abs: 2.2 10*3/uL (ref 0.7–4.0)
MCH: 29.8 pg (ref 26.0–34.0)
MCHC: 32.8 g/dL (ref 30.0–36.0)
MCV: 90.9 fL (ref 80.0–100.0)
Monocytes Absolute: 0.6 10*3/uL (ref 0.1–1.0)
Monocytes Relative: 8 %
Neutro Abs: 4.3 10*3/uL (ref 1.7–7.7)
Neutrophils Relative %: 58 %
Platelets: 256 10*3/uL (ref 150–400)
RBC: 2.75 MIL/uL — ABNORMAL LOW (ref 3.87–5.11)
RDW: 14 % (ref 11.5–15.5)
WBC: 7.3 10*3/uL (ref 4.0–10.5)
nRBC: 0 % (ref 0.0–0.2)

## 2019-07-08 LAB — COMPREHENSIVE METABOLIC PANEL
ALT: 19 U/L (ref 0–44)
AST: 21 U/L (ref 15–41)
Albumin: 2.8 g/dL — ABNORMAL LOW (ref 3.5–5.0)
Alkaline Phosphatase: 59 U/L (ref 38–126)
Anion gap: 8 (ref 5–15)
BUN: <5 mg/dL — ABNORMAL LOW (ref 8–23)
CO2: 23 mmol/L (ref 22–32)
Calcium: 8.9 mg/dL (ref 8.9–10.3)
Chloride: 108 mmol/L (ref 98–111)
Creatinine, Ser: 0.68 mg/dL (ref 0.44–1.00)
GFR calc Af Amer: >60 mL/min (ref >60)
GFR calc non Af Amer: >60 mL/min (ref >60)
Glucose, Bld: 120 mg/dL — ABNORMAL HIGH (ref 70–99)
Potassium: 3.5 mmol/L (ref 3.5–5.1)
Sodium: 139 mmol/L (ref 135–145)
Total Bilirubin: 0.8 mg/dL (ref 0.3–1.2)
Total Protein: 4.9 g/dL — ABNORMAL LOW (ref 6.5–8.1)

## 2019-07-08 LAB — MAGNESIUM: Magnesium: 1.7 mg/dL (ref 1.7–2.4)

## 2019-07-08 LAB — BRAIN NATRIURETIC PEPTIDE: B Natriuretic Peptide: 175.5 pg/mL — ABNORMAL HIGH (ref 0.0–100.0)

## 2019-07-08 LAB — SURGICAL PATHOLOGY

## 2019-07-08 LAB — D-DIMER, QUANTITATIVE: D-Dimer, Quant: 0.6 ug/mL-FEU — ABNORMAL HIGH (ref 0.00–0.50)

## 2019-07-08 LAB — C-REACTIVE PROTEIN: CRP: 0.6 mg/dL (ref <1.0)

## 2019-07-08 MED ORDER — CLOPIDOGREL BISULFATE 75 MG PO TABS: 75.0000 mg | ORAL_TABLET | Freq: Every day | ORAL | 3 refills | Status: DC

## 2019-07-08 MED ORDER — PANTOPRAZOLE SODIUM 40 MG PO TBEC: 40.0000 mg | DELAYED_RELEASE_TABLET | Freq: Two times a day (BID) | ORAL | 3 refills | Status: DC

## 2019-07-08 NOTE — Progress Notes (Signed)
Patient refuses [facility] services. She states she has her son and husband at home who are for her.

## 2019-07-08 NOTE — Discharge Summary (Signed)
Tiffany Wright [id number] DOB: 1945-08-26 DOA: 07/05/2019  PCP: [facility]  Admit date: 07/05/2019  Discharge date: 07/08/2019  Admitted From: Home   Disposition:  Home   Recommendations for Outpatient Follow-up:   Follow up with PCP in 1-2 weeks  PCP Please obtain BMP/CBC, 2 view CXR in 1week,  (see Discharge instructions)   PCP Please follow up on the following pending results:    Home Health: None   Equipment/Devices: None  Consultations: GI Discharge Condition: Stable    CODE STATUS: Full    Diet Recommendation: Heart Healthy   CC - weakness   Brief history of present illness from the day of admission and additional interim summary    Tiffany Wright a 74 y.o.femalewith medical history significant ofSTEMI in 2018 s/p DES to RCA, HLD. Patient presented to the ER with multiple episodes of nausea vomiting and then feeling weak, she did not look at her stool or her emesis and did not know the color of it. When she arrived in the ER she was found to have a hemoglobin of close to 8 down from a baseline of 13.8, she was admitted for further care.                                                                 Hospital Course    1. Acute anemia with generalized weakness due to UGI bleed - she required 2 units of packed RBC transfusion on 07/06/2019 along with IV PPI, she was seen by GI underwent EGD showing prepyloric gastric ulcers which explains why she never had elevated BUN since the blood transited out of stomach pretty quickly.  She is now symptom-free tolerating oral diet with stable posttransfusion H&H.  She will be placed on twice daily PPI, will follow with GI physician Dr. Collene Mares for ulcer biopsies and H. pylori results.  She was taking aspirin and Plavix prior to the procedure for drug-eluting  stents placed more than 2 years ago, post discharge she will be taking Plavix only starting on 07/11/2019 with twice daily PPI.  Have requested her to follow with PCP, her primary cardiologist and Dr. Collene Mares within a week of discharge.    2. CAD with drug-eluting stent more than 2 years ago. Was on dual antiplatelet therapy which have been changed to Plavix only due to upper GI bleed. Beta-blocker statin and Imdur continued.  3. Hypertension.  Blood pressure stabilized will start low-dose beta-blocker along with Imdur.  4. Dyslipidemia. On statin.   Discharge diagnosis     Principal Problem:   Gastrointestinal hemorrhage with melena Active Problems:   CAD (coronary artery disease)   Acute blood loss anemia    Discharge instructions    Discharge Instructions    Diet - low sodium heart healthy   Complete  by: As directed    Discharge instructions   Complete by: As directed    Follow with suggested GI doctor for your biopsy results and H. pylori test results.  Follow with Primary MD [facility] in 7 days   Get CBC, CMP  -  checked next visit within 1 week by Primary MD    Activity: As tolerated with Full fall precautions use walker/cane & assistance as needed  Disposition Home   Diet: Heart Healthy  immediately. Follow Cardiac Low Salt Diet and 1.5 lit/day fluid restriction.  Special Instructions: If you have smoked or chewed Tobacco  in the last 2 yrs please stop smoking, stop any regular Alcohol  and or any Recreational drug use.  On your next visit with your primary care physician please Get Medicines reviewed and adjusted.  Please request your Prim.MD to go over all Hospital Tests and Procedure/Radiological results at the follow up, please get all Hospital records sent to your Prim MD by signing hospital release before you go home.  If you experience worsening of your admission symptoms, develop shortness of breath, life threatening emergency, suicidal  or homicidal thoughts you must seek medical attention immediately by calling 911 or calling your MD immediately  if symptoms less severe.   Increase activity slowly   Complete by: As directed       Discharge Medications   Allergies as of 07/08/2019   No Known Allergies     Medication List    STOP taking these medications   aspirin 81 MG chewable tablet   nitrofurantoin (macrocrystal-monohydrate) 100 MG capsule Commonly known as: MACROBID     TAKE these medications   atorvastatin 80 MG tablet Commonly known as: LIPITOR TAKE 1 TABLET DAILY AT 6PM What changed: See the new instructions.   buPROPion 150 MG 24 hr tablet Commonly known as: WELLBUTRIN XL Take 150 mg by mouth daily.   CENTRUM SILVER PO Take 1 tablet by mouth daily.   clopidogrel 75 MG tablet Commonly known as: PLAVIX Take 1 tablet (75 mg total) by mouth daily. Start taking on: July 11, 2019 What changed: These instructions start on July 11, 2019. If you are unsure what to do until then, ask your doctor or other care provider.   isosorbide mononitrate 30 MG 24 hr tablet Commonly known as: IMDUR Take 1 tablet (30 mg total) by mouth daily.   loratadine 10 MG tablet Commonly known as: CLARITIN Take 10 mg by mouth daily.   metoprolol tartrate 25 MG tablet Commonly known as: LOPRESSOR TAKE 1 TABLET TWICE A DAY   mometasone 50 MCG/ACT nasal spray Commonly known as: NASONEX Place 2 sprays into the nose daily as needed (allergies).   nitroGLYCERIN 0.4 MG SL tablet Commonly known as: NITROSTAT PLACE 1 TABLET (0.4 MG TOTAL) UNDER THE TONGUE EVERY 5 (FIVE) MINUTES AS NEEDED FOR CHEST PAIN.   pantoprazole 40 MG tablet Commonly known as: Protonix Take 1 tablet (40 mg total) by mouth 2 (two) times daily.   Vitamin D3 125 MCG (5000 UT) Tabs Take 5,000 Units by mouth at bedtime.       Follow-up Information    [facility] Schedule an appointment as soon as possible for a visit in 1  week(s).   Specialty: Family Medicine Contact information: [place] [address] [phone]        Burnell Blanks, MD. Schedule an appointment as soon as possible for a visit in 1 week(s).  Specialty: Cardiology Contact information: [address] [place] [address] [phone]        Juanita Craver, MD. Schedule an appointment as soon as possible for a visit in 1 week(s).   Specialty: Gastroenterology Contact information: [address], [place] [address] [phone]           Major procedures and Radiology Reports - PLEASE review detailed and final reports thoroughly  -     EGD - showing 3 prepyloric stomach ulcers.   No results found.  Micro Results     Recent Results (from the past 240 hour(s))  SARS CORONAVIRUS 2 (TAT 6-24 HRS) Nasopharyngeal Nasopharyngeal Swab     Status: None   Collection Time: 07/05/19  7:00 PM   Specimen: Nasopharyngeal Swab  Result Value Ref Range Status   SARS Coronavirus 2 NEGATIVE NEGATIVE Final    Comment: (NOTE) SARS-CoV-2 target nucleic acids are NOT DETECTED. The SARS-CoV-2 RNA is generally detectable in upper and lower respiratory specimens during the acute phase of infection. Negative results do not preclude SARS-CoV-2 infection, do not rule out co-infections with other pathogens, and should not be used as the sole basis for treatment or other patient management decisions. Negative results must be combined with clinical observations, patient history, and epidemiological information. The expected result is Negative. Fact Sheet for Patients: SugarRoll.be Fact Sheet for Healthcare Providers: https://www.woods-mathews.com/ This test is not yet approved or cleared by the [place] FDA and  has been authorized for detection and/or diagnosis of SARS-CoV-2 by FDA under an Emergency Use Authorization (EUA). This EUA will  remain  in effect (meaning this test can be used) for the duration of the COVID-19 declaration under Section 56 4(b)(1) of the Act, 21 U.S.C. section 360bbb-3(b)(1), unless the authorization is terminated or revoked sooner. Performed at [facility], [place] [address], [place], [place] [address]     Today   Subjective    Tiffany Wright today has no headache,no chest abdominal pain,no new weakness tingling or numbness, feels much better wants to go home today.     Objective   Blood pressure 126/77, pulse 68, temperature 98.6 F (37 C), temperature source Oral, resp. rate 18, weight 65.9 kg, SpO2 97 %.   Intake/Output Summary (Last 24 hours) at 07/08/2019 0902 Last data filed at 07/08/2019 0600 Gross per 24 hour  Intake 2718.84 ml  Output 425 ml  Net 2293.84 ml    Exam Awake Alert,   No new F.N deficits, Normal affect .AT,PERRAL Supple Neck,No JVD, No cervical lymphadenopathy appriciated.  Symmetrical Chest wall movement, Good air movement bilaterally, CTAB RRR,No Gallops,Rubs or new Murmurs, No Parasternal Heave +ve B.Sounds, Abd Soft, Non tender, No organomegaly appriciated, No rebound -guarding or rigidity. No Cyanosis, Clubbing or edema, No new Rash or bruise   Data Review   CBC w Diff:  Lab Results  Component Value Date   WBC 7.3 07/08/2019   HGB 8.2 (L) 07/08/2019   HGB 13.4 10/16/2017   HCT 25.0 (L) 07/08/2019   HCT 40.2 10/16/2017   PLT 256 07/08/2019   PLT 300 10/16/2017   LYMPHOPCT 30 07/08/2019   MONOPCT 8 07/08/2019   EOSPCT 3 07/08/2019   BASOPCT 1 07/08/2019    CMP:  Lab Results  Component Value Date   NA 139 07/08/2019   NA 141 10/16/2017   K 3.5 07/08/2019   CL 108 07/08/2019   CO2 23 07/08/2019   BUN <5 (L) 07/08/2019  BUN 14 10/16/2017   CREATININE 0.68 07/08/2019   PROT 4.9 (L) 07/08/2019   PROT 6.4 10/16/2017   ALBUMIN 2.8 (L) 07/08/2019   ALBUMIN 4.4 10/16/2017   BILITOT 0.8 07/08/2019   BILITOT 0.5 10/16/2017   ALKPHOS 59  07/08/2019   AST 21 07/08/2019   ALT 19 07/08/2019  .   Total Time in preparing paper work, data evaluation and todays exam - 22 minutes  Lala Lund M.D on 07/08/2019 at 9:02 AM  [facility]   Office  [phone]

## 2019-07-08 NOTE — Plan of Care (Signed)
  Problem: Activity: Goal: Risk for activity intolerance will decrease Outcome: Progressing   Problem: Nutrition: Goal: Adequate nutrition will be maintained Outcome: Progressing   

## 2019-07-08 NOTE — Discharge Instructions (Signed)
Follow with suggested GI doctor for your biopsy results and H. pylori test results.  Follow with Primary MD [facility] in 7 days   Get CBC, CMP  -  checked next visit within 1 week by Primary MD    Activity: As tolerated with Full fall precautions use walker/cane & assistance as needed  Disposition Home   Diet: Heart Healthy  immediately. Follow Cardiac Low Salt Diet and 1.5 lit/day fluid restriction.  Special Instructions: If you have smoked or chewed Tobacco  in the last 2 yrs please stop smoking, stop any regular Alcohol  and or any Recreational drug use.  On your next visit with your primary care physician please Get Medicines reviewed and adjusted.  Please request your Prim.MD to go over all Hospital Tests and Procedure/Radiological results at the follow up, please get all Hospital records sent to your Prim MD by signing hospital release before you go home.  If you experience worsening of your admission symptoms, develop shortness of breath, life threatening emergency, suicidal or homicidal thoughts you must seek medical attention immediately by calling 911 or calling your MD immediately  if symptoms less severe.  You Must read complete instructions/literature along with all the possible adverse reactions/side effects for all the Medicines you take and that have been prescribed to you. Take any new Medicines after you have completely understood and accpet all the possible adverse reactions/side effects.

## 2019-07-08 NOTE — Evaluation (Addendum)
Occupational Therapy Evaluation and Discharge  Patient Details Name: Tiffany Wright MRN: [id number] DOB: 12/03/1945 Today's Date: 07/08/2019    History of Present Illness 74 yo female with onset of upper GI bleed and transfusion.  Presenting with anemia and weakness.  PMHx:  DM, HTN, STEMI, CAD with drug eluting stents, HLD   Clinical Impression   PTa patient independent and working. Admitted for above and presenting at near baseline level for ADls, mobility and transfers.  Educated on pacing and energy conservation to optimize safety and slowly increase activity tolerance. No further OT needs identified. OT will sign off.     Follow Up Recommendations  No OT follow up    Equipment Recommendations  None recommended by OT    Recommendations for Other Services       Precautions / Restrictions Precautions Precautions: Fall Restrictions Weight Bearing Restrictions: No      Mobility Bed Mobility Overal bed mobility: Modified Independent             General bed mobility comments: no assist required   Transfers Overall transfer level: Modified independent               General transfer comment: no assist required     Balance Overall balance assessment: Needs assistance Sitting-balance support: No upper extremity supported;Feet supported Sitting balance-Leahy Scale: Good     Standing balance support: During functional activity;No upper extremity supported Standing balance-Leahy Scale: Good                             ADL either performed or assessed with clinical judgement   ADL Overall ADL's : Modified independent;At baseline                                       General ADL Comments: patient demostrates ability to complete ADLs and transfers with modified independence      Vision   Vision Assessment?: No apparent visual deficits     Perception     Praxis      Pertinent Vitals/Pain Pain Assessment: No/denies pain      Hand Dominance Right   Extremity/Trunk Assessment Upper Extremity Assessment Upper Extremity Assessment: Overall WFL for tasks assessed   Lower Extremity Assessment Lower Extremity Assessment: Defer to PT evaluation   Cervical / Trunk Assessment Cervical / Trunk Assessment: Kyphotic   Communication Communication Communication: No difficulties   Cognition Arousal/Alertness: Awake/alert Behavior During Therapy: WFL for tasks assessed/performed Overall Cognitive Status: Within Functional Limits for tasks assessed                                     General Comments  no AD for mobility, pt very active and educated on energy conservation and pacing      Exercises     Shoulder Instructions      Home Living Family/patient expects to be discharged to:: Private residence Living Arrangements: Spouse/significant other Available Help at Discharge: Family;Available 24 hours/day Type of Home: House Home Access: Level entry     Home Layout: One level     Bathroom Shower/Tub: Teacher, early years/pre: Standard     Home Equipment: Environmental consultant - 2 wheels;Cane - single point;Grab bars - tub/shower          Prior Functioning/Environment  Level of Independence: Independent        Comments: independent ADls, IADLs, driving; works at [employer] home from 4pm-8pm (just retired from sewing job)         OT Problem List: Decreased strength;Decreased activity tolerance      OT Treatment/Interventions:      OT Goals(Current goals can be found in the care plan section) Acute Rehab OT Goals Patient Stated Goal: to get home today  OT Goal Formulation: With patient  OT Frequency:     Barriers to D/C:            Co-evaluation              AM-PAC OT "6 Clicks" Daily Activity     Outcome Measure Help from another person eating meals?: None Help from another person taking care of personal grooming?: None Help from another person toileting,  which includes using toliet, bedpan, or urinal?: None Help from another person bathing (including washing, rinsing, drying)?: None Help from another person to put on and taking off regular upper body clothing?: None Help from another person to put on and taking off regular lower body clothing?: None 6 Click Score: 24   End of Session Nurse Communication: Mobility status  Activity Tolerance: Patient tolerated treatment well Patient left: in bed;with call bell/phone within reach  OT Visit Diagnosis: Muscle weakness (generalized) (M62.81);Unsteadiness on feet (R26.81)                Time: [id number] OT Time Calculation (min): 19 min Charges:  OT General Charges $OT Visit: 1 Visit OT Evaluation $OT Eval Low Complexity: 1 Low  Jolaine Artist, OT Acute Rehabilitation Services Pager [phone] Office [phone]    [name] 07/08/2019, 8:22 AM

## 2019-07-09 ENCOUNTER — Ambulatory Visit: Payer: Medicare Other | Admitting: Cardiology

## 2019-07-09 ENCOUNTER — Other Ambulatory Visit: Payer: Medicare Other

## 2019-07-09 LAB — TYPE AND SCREEN
ABO/RH(D): O NEG
Antibody Screen: NEGATIVE
Unit division: 0
Unit division: 0

## 2019-07-09 LAB — BPAM RBC
Blood Product Expiration Date: 202102152359
Blood Product Expiration Date: 202102152359
ISSUE DATE / TIME: 202102071134
Unit Type and Rh: 9500
Unit Type and Rh: 9500

## 2019-07-14 ENCOUNTER — Ambulatory Visit: Payer: Medicare Other | Admitting: Cardiology

## 2019-07-15 ENCOUNTER — Ambulatory Visit (INDEPENDENT_AMBULATORY_CARE_PROVIDER_SITE_OTHER): Payer: Medicare Other | Admitting: Cardiology

## 2019-07-15 ENCOUNTER — Other Ambulatory Visit: Payer: Self-pay

## 2019-07-15 ENCOUNTER — Encounter: Payer: Self-pay | Admitting: Cardiology

## 2019-07-15 VITALS — BP 140/82 | HR 70 | Ht 60.0 in | Wt 141.0 lb

## 2019-07-15 DIAGNOSIS — I2510 Atherosclerotic heart disease of native coronary artery without angina pectoris: Secondary | ICD-10-CM

## 2019-07-15 DIAGNOSIS — K922 Gastrointestinal hemorrhage, unspecified: Secondary | ICD-10-CM | POA: Diagnosis not present

## 2019-07-15 DIAGNOSIS — I10 Essential (primary) hypertension: Secondary | ICD-10-CM | POA: Diagnosis not present

## 2019-07-15 NOTE — Progress Notes (Signed)
Cardiology Office Note:    Date:  07/15/2019   ID:  Tiffany Wright, DOB 09-11-1945, MRN [id number]  PCP:  [facility]  Cardiologist:  Lauree Chandler, MD  Electrophysiologist:  None   Referring MD: [facility]   Chief Complaint  Patient presents with  . Hospitalization Follow-up    History of Present Illness:    Tiffany Wright is a 74 y.o. female with a hx of Lyme disease status post DES to her RCA in 2018 if the elevation MI hyperlipidemia, hypertriglyceridemia, tobacco use.  The patient was last seen by me 1 March 31, 2019 at that time she was experiencing intermittent chest pain.  At her visit the pain seemed atypical therefore I started patient on intermittent review her stress test from 2019 shows normal EF, no ischemia evaluation was performed.  She was continued on her medication regimen which includes dual antiplatelet therapy.  In the interim the patient was last admitted to hospital on July 05, 2019 at that time.  See last week results found to have upper GI bleeding.  She received 2 units of red blood cells underwent endoscopy which showed prepyloric gastric ulcers.  Starting PPI, aspirin was stopped.  Plavix was continued.  Today for follow-up visit patient tells me that she is much improved.  She has been has resolved.  She is grateful that she was able to get her upper GI work-up and understands what is going.   Past Medical History:  Diagnosis Date  . CAD (coronary artery disease)    a. 10/2016: inferior STEMI s/p DES to RCA  . Hyperlipidemia   . Myocardial infarction ([place])   . Tobacco abuse     Past Surgical History:  Procedure Laterality Date  . BIOPSY  07/07/2019   Procedure: BIOPSY;  Surgeon: Juanita Craver, MD;  Location: [facility] ENDOSCOPY;  Service: Endoscopy;;  . CHOLECYSTECTOMY    . CORONARY STENT INTERVENTION N/A 11/17/2016   Procedure: Coronary Stent Intervention;  Surgeon: Burnell Blanks, MD;  Location: [place] [facility]  LAB;  Service: Cardiovascular;  Laterality: N/A;  . ESOPHAGOGASTRODUODENOSCOPY (EGD) WITH PROPOFOL N/A 07/07/2019   Procedure: ESOPHAGOGASTRODUODENOSCOPY (EGD) WITH PROPOFOL;  Surgeon: Juanita Craver, MD;  Location: [facility] ENDOSCOPY;  Service: Endoscopy;  Laterality: N/A;  . LEFT HEART CATH AND CORONARY ANGIOGRAPHY N/A 11/17/2016   Procedure: Left Heart Cath and Coronary Angiography;  Surgeon: Burnell Blanks, MD;  Location: [place] [facility] LAB;  Service: Cardiovascular;  Laterality: N/A;    Current Medications: Current Meds  Medication Sig  . atorvastatin (LIPITOR) 80 MG tablet TAKE 1 TABLET DAILY AT 6PM (Patient taking differently: Take 80 mg by mouth daily at 6 PM. TAKE 1 TABLET DAILY AT 6PM)  . buPROPion (WELLBUTRIN XL) 150 MG 24 hr tablet Take 150 mg by mouth daily.  . Cholecalciferol (VITAMIN D3) 5000 units TABS Take 5,000 Units by mouth at bedtime.   . clopidogrel (PLAVIX) 75 MG tablet Take 1 tablet (75 mg total) by mouth daily.  . fluticasone (FLONASE) 50 MCG/ACT nasal spray Place 2 sprays into both nostrils daily.  Marland Kitchen loratadine (CLARITIN) 10 MG tablet Take 10 mg by mouth daily.   . metoprolol tartrate (LOPRESSOR) 25 MG tablet TAKE 1 TABLET TWICE A DAY (Patient taking differently: Take 25 mg by mouth 2 (two) times daily. )  . mometasone (NASONEX) 50 MCG/ACT nasal spray Place 2 sprays into the nose daily as needed (allergies).  . Multiple Vitamins-Minerals (CENTRUM SILVER PO) Take 1 tablet by mouth  daily.  . nitroGLYCERIN (NITROSTAT) 0.4 MG SL tablet PLACE 1 TABLET (0.4 MG TOTAL) UNDER THE TONGUE EVERY 5 (FIVE) MINUTES AS NEEDED FOR CHEST PAIN.  Marland Kitchen pantoprazole (PROTONIX) 40 MG tablet Take 1 tablet (40 mg total) by mouth 2 (two) times daily.  . [DISCONTINUED] isosorbide mononitrate (IMDUR) 30 MG 24 hr tablet Take 1 tablet (30 mg total) by mouth daily.     Allergies:   Patient has no known allergies.   Social History   Socioeconomic History  . Marital status: Married    Spouse name:  Not on file  . Number of children: Not on file  . Years of education: Not on file  . Highest education level: Not on file  Occupational History  . Not on file  Tobacco Use  . Smoking status: Former Smoker    Packs/day: 1.00    Years: 30.00    Pack years: 30.00    Types: Cigarettes    Quit date: 11/09/2016    Years since quitting: 2.6  . Smokeless tobacco: Never Used  Substance and Sexual Activity  . Alcohol use: No  . Drug use: No  . Sexual activity: Not on file  Other Topics Concern  . Not on file  Social History Narrative  . Not on file   Social Determinants of Health   Financial Resource Strain:   . Difficulty of Paying Living Expenses: Not on file  Food Insecurity:   . Worried About Charity fundraiser in the Last Year: Not on file  . Ran Out of Food in the Last Year: Not on file  Transportation Needs:   . Lack of Transportation (Medical): Not on file  . Lack of Transportation (Non-Medical): Not on file  Physical Activity:   . Days of Exercise per Week: Not on file  . Minutes of Exercise per Session: Not on file  Stress:   . Feeling of Stress : Not on file  Social Connections:   . Frequency of Communication with Friends and Family: Not on file  . Frequency of Social Gatherings with Friends and Family: Not on file  . Attends Religious Services: Not on file  . Active Member of Clubs or Organizations: Not on file  . Attends Archivist Meetings: Not on file  . Marital Status: Not on file     Family History: The patient's family history includes Angina in her mother; Heart attack in her brother.  ROS:   Review of Systems  Constitution: Negative for decreased appetite, fever and weight gain.  HENT: Negative for congestion, ear discharge, hoarse voice and sore throat.   Eyes: Negative for discharge, redness, vision loss in right eye and visual halos.  Cardiovascular: Negative for chest pain, dyspnea on exertion, leg swelling, orthopnea and palpitations.    Respiratory: Negative for cough, hemoptysis, shortness of breath and snoring.   Endocrine: Negative for heat intolerance and polyphagia.  Hematologic/Lymphatic: Negative for bleeding problem. Does not bruise/bleed easily.  Skin: Negative for flushing, nail changes, rash and suspicious lesions.  Musculoskeletal: Negative for arthritis, joint pain, muscle cramps, myalgias, neck pain and stiffness.  Gastrointestinal: Negative for abdominal pain, bowel incontinence, diarrhea and excessive appetite.  Genitourinary: Negative for decreased libido, genital sores and incomplete emptying.  Neurological: Negative for brief paralysis, focal weakness, headaches and loss of balance.  Psychiatric/Behavioral: Negative for altered mental status, depression and suicidal ideas.  Allergic/Immunologic: Negative for HIV exposure and persistent infections.    EKGs/Labs/Other Studies Reviewed:    The following  studies were reviewed today:   EKG:  The ekg ordered today demonstrates   Recent Labs: 07/08/2019: ALT 19; B Natriuretic Peptide 175.5; BUN <5; Creatinine, Ser 0.68; Hemoglobin 8.2; Magnesium 1.7; Platelets 256; Potassium 3.5; Sodium 139  Recent Lipid Panel    Component Value Date/Time   CHOL 130 04/04/2019 0938   TRIG 114 04/04/2019 0938   HDL 29 (L) 04/04/2019 0938   CHOLHDL 4.5 (H) 04/04/2019 0938   CHOLHDL 4.8 11/18/2016 0030   VLDL 21 11/18/2016 0030   LDLCALC 80 04/04/2019 0938    Physical Exam:    VS:  BP 140/82 (BP Location: Left Arm, Patient Position: Sitting, Cuff Size: Normal)   Pulse 70   Ht 5' (1.524 m)   Wt 141 lb (64 kg)   SpO2 99%   BMI 27.54 kg/m     Wt Readings from Last 3 Encounters:  07/15/19 141 lb (64 kg)  07/06/19 145 lb 4.7 oz (65.9 kg)  03/31/19 139 lb 6.4 oz (63.2 kg)     GEN: Well nourished, well developed in no acute distress HEENT: Normal NECK: No JVD; No carotid bruits LYMPHATICS: No lymphadenopathy CARDIAC: S1S2 noted,RRR, no murmurs, rubs,  gallops RESPIRATORY:  Clear to auscultation without rales, wheezing or rhonchi  ABDOMEN: Soft, non-tender, non-distended, +bowel sounds, no guarding. EXTREMITIES: No edema, No cyanosis, no clubbing MUSCULOSKELETAL:  No deformity  SKIN: Warm and dry NEUROLOGIC:  Alert and oriented x 3, non-focal PSYCHIATRIC:  Normal affect, good insight  ASSESSMENT:    1. Upper GI bleed   2. Coronary artery disease involving native coronary artery of native heart without angina pectoris   3. Essential hypertension    PLAN:    She seems to be doing well post hospitalization.  Aspirin was stopped due to her recent GI bleed.  We will continue patient on Plavix 75 mg.  Follow-up posthospitalization CBC will be done today.  Encourage patient to continue her PPI (Protonix 40 mg twice a day).  From a cardiovascular standpoint we will continue patient on her medication regimen.  The patient is in agreement with the above plan. The patient left the office in stable condition.  The patient will follow up in 3 months or sooner if needed.   Medication Adjustments/Labs and Tests Ordered: Current medicines are reviewed at length with the patient today.  Concerns regarding medicines are outlined above.  Orders Placed This Encounter  Procedures  . CBC   No orders of the defined types were placed in this encounter.   Patient Instructions  Medication Instructions:  Your physician has recommended you make the following change in your medication:   STOP: Isosorbide 30 Mg (Imdur)   *If you need a refill on your cardiac medications before your next appointment, please call your pharmacy*  Lab Work: None If you have labs (blood work) drawn today and your tests are completely normal, you will receive your results only by: Marland Kitchen MyChart Message (if you have MyChart) OR . A paper copy in the mail If you have any lab test that is abnormal or we need to change your treatment, we will call you to review the  results.  Testing/Procedures: Your physician recommends that you return for lab work in: TODAY CBC   Follow-Up: At [facility], you and your health needs are our priority.  As part of our continuing mission to provide you with exceptional heart care, we have created designated Provider Care Teams.  These Care Teams include your primary Cardiologist (physician) and  Advanced Practice Providers (APPs -  Physician Assistants and Nurse Practitioners) who all work together to provide you with the care you need, when you need it.  Your next appointment:   3 month(s)  The format for your next appointment:   In Person  Provider:   Berniece Salines, DO  Other Instructions      Adopting a Healthy Lifestyle.  Know what a healthy weight is for you (roughly BMI <25) and aim to maintain this   Aim for 7+ servings of fruits and vegetables daily   65-80+ fluid ounces of water or unsweet tea for healthy kidneys   Limit to max 1 drink of alcohol per day; avoid smoking/tobacco   Limit animal fats in diet for cholesterol and heart health - choose grass fed whenever available   Avoid highly processed foods, and foods high in saturated/trans fats   Aim for low stress - take time to unwind and care for your mental health   Aim for 150 min of moderate intensity exercise weekly for heart health, and weights twice weekly for bone health   Aim for 7-9 hours of sleep daily   When it comes to diets, agreement about the perfect plan isnt easy to find, even among the experts. Experts at the [place] developed an idea known as the Healthy Eating Plate. Just imagine a plate divided into logical, healthy portions.   The emphasis is on diet quality:   Load up on vegetables and fruits - one-half of your plate: Aim for color and variety, and remember that potatoes dont count.   Go for whole grains - one-quarter of your plate: Whole wheat, barley, wheat berries, quinoa, oats, brown  rice, and foods made with them. If you want pasta, go with whole wheat pasta.   Protein power - one-quarter of your plate: Fish, chicken, beans, and nuts are all healthy, versatile protein sources. Limit red meat.   The diet, however, does go beyond the plate, offering a few other suggestions.   Use healthy plant oils, such as olive, canola, soy, corn, sunflower and peanut. Check the labels, and avoid partially hydrogenated oil, which have unhealthy trans fats.   If youre thirsty, drink water. Coffee and tea are good in moderation, but skip sugary drinks and limit milk and dairy products to one or two daily servings.   The type of carbohydrate in the diet is more important than the amount. Some sources of carbohydrates, such as vegetables, fruits, whole grains, and beans-are healthier than others.   Finally, stay active  Signed, Berniece Salines, DO  07/15/2019 3:43 PM    [facility]

## 2019-07-15 NOTE — Patient Instructions (Addendum)
Medication Instructions:  Your physician has recommended you make the following change in your medication:   STOP: Isosorbide 30 Mg (Imdur)   *If you need a refill on your cardiac medications before your next appointment, please call your pharmacy*  Lab Work: None If you have labs (blood work) drawn today and your tests are completely normal, you will receive your results only by: Marland Kitchen MyChart Message (if you have MyChart) OR . A paper copy in the mail If you have any lab test that is abnormal or we need to change your treatment, we will call you to review the results.  Testing/Procedures: Your physician recommends that you return for lab work in: TODAY CBC   Follow-Up: At [facility], you and your health needs are our priority.  As part of our continuing mission to provide you with exceptional heart care, we have created designated Provider Care Teams.  These Care Teams include your primary Cardiologist (physician) and Advanced Practice Providers (APPs -  Physician Assistants and Nurse Practitioners) who all work together to provide you with the care you need, when you need it.  Your next appointment:   3 month(s)  The format for your next appointment:   In Person  Provider:   Berniece Salines, DO  Other Instructions

## 2019-07-16 LAB — CBC
Hematocrit: 33.6 % — ABNORMAL LOW (ref 34.0–46.6)
Hemoglobin: 10.5 g/dL — ABNORMAL LOW (ref 11.1–15.9)
MCH: 28.4 pg (ref 26.6–33.0)
MCHC: 31.3 g/dL — ABNORMAL LOW (ref 31.5–35.7)
MCV: 91 fL (ref 79–97)
Platelets: 455 10*3/uL — ABNORMAL HIGH (ref 150–450)
RBC: 3.7 x10E6/uL — ABNORMAL LOW (ref 3.77–5.28)
RDW: 13.1 % (ref 11.7–15.4)
WBC: 8.2 10*3/uL (ref 3.4–10.8)

## 2019-08-04 ENCOUNTER — Telehealth: Payer: Self-pay | Admitting: Cardiology

## 2019-08-04 NOTE — Telephone Encounter (Signed)
I printed labs and had Medical Records fax to [facility].

## 2019-08-04 NOTE — Telephone Encounter (Signed)
  Tiffany Wright from [facility] requesting for pt's last blood work results on 07/15/19. She provided fax # [phone]  Please advise

## 2019-09-11 ENCOUNTER — Telehealth: Payer: Self-pay | Admitting: Cardiology

## 2019-09-11 NOTE — Telephone Encounter (Signed)
Yes is okay to hold the Plavix thank you

## 2019-09-11 NOTE — Telephone Encounter (Addendum)
   Primary Cardiologist: Dr. Harriet Masson  Chart reviewed as part of pre-operative protocol coverage. Patient has upcoming colonoscopy planned and we were asked to give our recommendations on holding Plavix.   Per Dr. Harriet Masson, [place] to hold Plavix as requested. This should be restarted as soon as possible following procedure.   I will route this recommendation to the requesting party via Epic fax function and remove from pre-op pool.  Please call with questions.  Darreld Mclean, PA-C 09/11/2019, 10:47 AM

## 2019-09-11 NOTE — Telephone Encounter (Signed)
Hi Dr. Harriet Masson,   Tiffany Wright has upcoming colonoscopy planned for 09/19/2019 and is being asked to hold is Plavix for 5 days prior. She has a history of CAD with inferior STEMI in 2018 s/p DES to RCA. Myoview in 09/2017 was low risk with no evidence of ischemia. You law saw her on 07/15/2019 at which time she was doing well from a cardiac standpoint. Is it OK for her to hold Plavix for 5 days?  Please route response back to P CV DIV PREOP.  Thank you! Taryne Kiger

## 2019-09-11 NOTE — Telephone Encounter (Signed)
   [facility] Pre-operative Risk Assessment    Request for surgical clearance:  1. What type of surgery is being performed? Colonoscopy   2. When is this surgery scheduled? 09/19/19  3. What type of clearance is required (medical clearance vs. Pharmacy clearance to hold med vs. Both)? Pharmacy   4. Are there any medications that need to be held prior to surgery and how long?  Stop Plavix on 09/14/19  5. Practice name and name of physician performing surgery? [facility] Dr. Hazle Quant   6. What is your office phone number [phone]   7.   What is your office fax number [phone]  8.   Anesthesia type (None, local, MAC, general) ? Not sure    Trilby Drummer 09/11/2019, 8:26 AM  _________________________________________________________________   (provider comments below)  Answer needed by today 09/11/19 due to this being the last day they are in office

## 2019-10-09 ENCOUNTER — Other Ambulatory Visit: Payer: Self-pay

## 2019-10-10 ENCOUNTER — Other Ambulatory Visit: Payer: Self-pay

## 2019-10-10 ENCOUNTER — Ambulatory Visit (INDEPENDENT_AMBULATORY_CARE_PROVIDER_SITE_OTHER): Payer: Medicare Other | Admitting: Cardiology

## 2019-10-10 ENCOUNTER — Encounter: Payer: Self-pay | Admitting: Cardiology

## 2019-10-10 VITALS — BP 110/70 | HR 66 | Ht 60.0 in | Wt 138.0 lb

## 2019-10-10 DIAGNOSIS — E782 Mixed hyperlipidemia: Secondary | ICD-10-CM | POA: Diagnosis not present

## 2019-10-10 DIAGNOSIS — I2510 Atherosclerotic heart disease of native coronary artery without angina pectoris: Secondary | ICD-10-CM

## 2019-10-10 DIAGNOSIS — Z720 Tobacco use: Secondary | ICD-10-CM | POA: Diagnosis not present

## 2019-10-10 DIAGNOSIS — Z8719 Personal history of other diseases of the digestive system: Secondary | ICD-10-CM

## 2019-10-10 NOTE — Progress Notes (Signed)
Cardiology Office Note:    Date:  10/10/2019   ID:  Tiffany Wright, DOB Jun 06, 1945, MRN [id number]  PCP:  [facility]  Cardiologist:  Lauree Chandler, MD  Electrophysiologist:  None   Referring MD: [facility] [place]   Chief Complaint  Patient presents with  . Follow-up    History of Present Illness:    Tiffany Wright is a 74 y.o. female with a hx of Lyme disease status post DES to her RCA in 2018 if the elevation MI hyperlipidemia, hypertriglyceridemia, tobacco use.  The patient was last seen by me 1 March 31, 2019 at that time she was experiencing intermittent chest pain.  At her visit the pain seemed atypical therefore I started patient on intermittent review her stress test from 2019 shows normal EF, no ischemia evaluation was performed.  She was continued on her medication regimen which includes dual antiplatelet therapy.  In February 2021 popliteal bleeding, she received 2 units of packed red blood cells.  Her aspirin was stopped.  Plavix was continued.  And she was started on a PPI.  Seen on July 15, 2019 at that time she was posthospitalization.  Continue her PPIs as well as her Plavix.  She is here for 17-month follow-up.  She tells me that she is doing a lot better.  She recently had endoscopy with Dr. Lyda Jester GI she was told that her ulcer had healed significantly.  He cut back on her Protonix to 40 mg daily.   Past Medical History:  Diagnosis Date  . Acute blood loss anemia 07/05/2019  . Acute ST elevation myocardial infarction (STEMI) of inferior wall (HCC)   . CAD (coronary artery disease)    a. 10/2016: inferior STEMI s/p DES to RCA  . Gastrointestinal hemorrhage with melena 07/05/2019  . Hyperlipidemia   . Myocardial infarction ([place])   . Status post coronary artery stent placement   . Tobacco abuse     Past Surgical History:  Procedure Laterality Date  . BIOPSY  07/07/2019   Procedure: BIOPSY;  Surgeon: Juanita Craver, MD;  Location: [facility]  ENDOSCOPY;  Service: Endoscopy;;  . CHOLECYSTECTOMY    . COLONOSCOPY    . CORONARY STENT INTERVENTION N/A 11/17/2016   Procedure: Coronary Stent Intervention;  Surgeon: Burnell Blanks, MD;  Location: [place] [facility];  Service: Cardiovascular;  Laterality: N/A;  . ESOPHAGOGASTRODUODENOSCOPY (EGD) WITH PROPOFOL N/A 07/07/2019   Procedure: ESOPHAGOGASTRODUODENOSCOPY (EGD) WITH PROPOFOL;  Surgeon: Juanita Craver, MD;  Location: [facility] ENDOSCOPY;  Service: Endoscopy;  Laterality: N/A;  . LEFT HEART CATH AND CORONARY ANGIOGRAPHY N/A 11/17/2016   Procedure: Left Heart Cath and Coronary Angiography;  Surgeon: Burnell Blanks, MD;  Location: [place] [facility];  Service: Cardiovascular;  Laterality: N/A;    Current Medications: Current Meds  Medication Sig  . atorvastatin (LIPITOR) 80 MG tablet TAKE 1 TABLET DAILY AT 6PM  . buPROPion (WELLBUTRIN XL) 150 MG 24 hr tablet Take 150 mg by mouth daily.  Marland Kitchen buPROPion (ZYBAN) 150 MG 12 hr tablet Take 150 mg by mouth daily.  . Cholecalciferol (VITAMIN D3) 5000 units TABS Take 5,000 Units by mouth at bedtime.   . clopidogrel (PLAVIX) 75 MG tablet Take 1 tablet (75 mg total) by mouth daily.  . ferrous sulfate 325 (65 FE) MG EC tablet Take 325 mg by mouth in the morning and at bedtime.  . fluticasone (FLONASE) 50 MCG/ACT nasal spray Place 2 sprays into both nostrils daily.  Marland Kitchen loratadine (CLARITIN) 10 MG  tablet Take 10 mg by mouth daily.   . metoprolol tartrate (LOPRESSOR) 25 MG tablet TAKE 1 TABLET TWICE A DAY  . mometasone (NASONEX) 50 MCG/ACT nasal spray Place 2 sprays into the nose daily as needed (allergies).  . Multiple Vitamins-Minerals (CENTRUM SILVER PO) Take 1 tablet by mouth daily.  . nitroGLYCERIN (NITROSTAT) 0.4 MG SL tablet PLACE 1 TABLET (0.4 MG TOTAL) UNDER THE TONGUE EVERY 5 (FIVE) MINUTES AS NEEDED FOR CHEST PAIN.  Marland Kitchen pantoprazole (PROTONIX) 40 MG tablet Take 40 mg by mouth daily.  . traZODone (DESYREL) 50 MG tablet Take 50 mg by mouth  daily as needed.  . [DISCONTINUED] pantoprazole (PROTONIX) 40 MG tablet Take 1 tablet (40 mg total) by mouth 2 (two) times daily. (Patient taking differently: Take 40 mg by mouth daily. )     Allergies:   Patient has no known allergies.   Social History   Socioeconomic History  . Marital status: Married    Spouse name: Not on file  . Number of children: Not on file  . Years of education: Not on file  . Highest education level: Not on file  Occupational History  . Not on file  Tobacco Use  . Smoking status: Former Smoker    Packs/day: 1.00    Years: 30.00    Pack years: 30.00    Types: Cigarettes    Quit date: 11/09/2016    Years since quitting: 2.9  . Smokeless tobacco: Never Used  Substance and Sexual Activity  . Alcohol use: No  . Drug use: No  . Sexual activity: Not on file  Other Topics Concern  . Not on file  Social History Narrative  . Not on file   Social Determinants of Health   Financial Resource Strain:   . Difficulty of Paying Living Expenses:   Food Insecurity:   . Worried About Charity fundraiser in the Last Year:   . Arboriculturist in the Last Year:   Transportation Needs:   . Film/video editor (Medical):   Marland Kitchen Lack of Transportation (Non-Medical):   Physical Activity:   . Days of Exercise per Week:   . Minutes of Exercise per Session:   Stress:   . Feeling of Stress :   Social Connections:   . Frequency of Communication with Friends and Family:   . Frequency of Social Gatherings with Friends and Family:   . Attends Religious Services:   . Active Member of Clubs or Organizations:   . Attends Archivist Meetings:   Marland Kitchen Marital Status:      Family History: The patient's family history includes Angina in her mother; Heart attack in her brother.  ROS:   Review of Systems  Constitution: Negative for decreased appetite, fever and weight gain.  HENT: Negative for congestion, ear discharge, hoarse voice and sore throat.   Eyes:  Negative for discharge, redness, vision loss in right eye and visual halos.  Cardiovascular: Negative for chest pain, dyspnea on exertion, leg swelling, orthopnea and palpitations.  Respiratory: Negative for cough, hemoptysis, shortness of breath and snoring.   Endocrine: Negative for heat intolerance and polyphagia.  Hematologic/Lymphatic: Negative for bleeding problem. Does not bruise/bleed easily.  Skin: Negative for flushing, nail changes, rash and suspicious lesions.  Musculoskeletal: Negative for arthritis, joint pain, muscle cramps, myalgias, neck pain and stiffness.  Gastrointestinal: Negative for abdominal pain, bowel incontinence, diarrhea and excessive appetite.  Genitourinary: Negative for decreased libido, genital sores and incomplete emptying.  Neurological:  Negative for brief paralysis, focal weakness, headaches and loss of balance.  Psychiatric/Behavioral: Negative for altered mental status, depression and suicidal ideas.  Allergic/Immunologic: Negative for HIV exposure and persistent infections.    EKGs/Labs/Other Studies Reviewed:    The following studies were reviewed today:   EKG: None today   Recent Labs: 07/08/2019: ALT 19; B Natriuretic Peptide 175.5; BUN <5; Creatinine, Ser 0.68; Magnesium 1.7; Potassium 3.5; Sodium 139 07/15/2019: Hemoglobin 10.5; Platelets 455  Recent Lipid Panel    Component Value Date/Time   CHOL 130 04/04/2019 0938   TRIG 114 04/04/2019 0938   HDL 29 (L) 04/04/2019 0938   CHOLHDL 4.5 (H) 04/04/2019 0938   CHOLHDL 4.8 11/18/2016 0030   VLDL 21 11/18/2016 0030   LDLCALC 80 04/04/2019 0938    Physical Exam:    VS:  BP 110/70 (BP Location: Right Arm, Patient Position: Sitting, Cuff Size: Normal)   Pulse 66   Ht 5' (1.524 m)   Wt 138 lb (62.6 kg)   SpO2 99%   BMI 26.95 kg/m     Wt Readings from Last 3 Encounters:  10/10/19 138 lb (62.6 kg)  07/15/19 141 lb (64 kg)  07/06/19 145 lb 4.7 oz (65.9 kg)     GEN: Well nourished, well  developed in no acute distress HEENT: Normal NECK: No JVD; No carotid bruits LYMPHATICS: No lymphadenopathy CARDIAC: S1S2 noted,RRR, no murmurs, rubs, gallops RESPIRATORY:  Clear to auscultation without rales, wheezing or rhonchi  ABDOMEN: Soft, non-tender, non-distended, +bowel sounds, no guarding. EXTREMITIES: No edema, No cyanosis, no clubbing MUSCULOSKELETAL:  No deformity  SKIN: Warm and dry NEUROLOGIC:  Alert and oriented x 3, non-focal PSYCHIATRIC:  Normal affect, good insight  ASSESSMENT:    1. Coronary artery disease involving native coronary artery of native heart without angina pectoris   2. Mixed hyperlipidemia   3. Tobacco abuse   4. H/O: GI bleed    PLAN:     1.  Continue patient on Plavix and atorvastatin for coronary artery disease.  Today she has no anginal symptoms.  2.  Hyperlipidemia atorvastatin.  3.  Tobacco use-the patient was counseled on tobacco cessation today for 5 minutes.  Counseling included reviewing the risks of smoking tobacco products, how it impacts the patient's current medical diagnoses and different strategies for quitting.  Pharmacotherapy to aid in tobacco cessation was not prescribed today. The patient coordinate with  primary care provider.  The patient was also advised to call   1-800-QUIT-NOW [phone]) for additional help with quitting smoking.  4.  Patient has improved significantly she will continue on her Protonix as recommended by GI.  The patient is in agreement with the above plan. The patient left the office in stable condition.  The patient will follow up in 6 months.    Medication Adjustments/Labs and Tests Ordered: Current medicines are reviewed at length with the patient today.  Concerns regarding medicines are outlined above.  No orders of the defined types were placed in this encounter.  No orders of the defined types were placed in this encounter.   Patient Instructions  Medication Instructions:  Your  physician recommends that you continue on your current medications as directed. Please refer to the Current Medication list given to you today.  *If you need a refill on your cardiac medications before your next appointment, please call your pharmacy*   Lab Work: None If you have labs (blood work) drawn today and your tests are completely normal, you will receive your results  only by: Marland Kitchen MyChart Message (if you have MyChart) OR . A paper copy in the mail If you have any lab test that is abnormal or we need to change your treatment, we will call you to review the results.   Testing/Procedures: None   Follow-Up: At [facility], you and your health needs are our priority.  As part of our continuing mission to provide you with exceptional heart care, we have created designated Provider Care Teams.  These Care Teams include your primary Cardiologist (physician) and Advanced Practice Providers (APPs -  Physician Assistants and Nurse Practitioners) who all work together to provide you with the care you need, when you need it.  We recommend signing up for the patient portal called "MyChart".  Sign up information is provided on this After Visit Summary.  MyChart is used to connect with patients for Virtual Visits (Telemedicine).  Patients are able to view lab/test results, encounter notes, upcoming appointments, etc.  Non-urgent messages can be sent to your provider as well.   To learn more about what you can do with MyChart, go to [email or web address].    Your next appointment:   6 month(s)  The format for your next appointment:   In Person  Provider:   Berniece Salines, DO   Other Instructions      Adopting a Healthy Lifestyle.  Know what a healthy weight is for you (roughly BMI <25) and aim to maintain this   Aim for 7+ servings of fruits and vegetables daily   65-80+ fluid ounces of water or unsweet tea for healthy kidneys   Limit to max 1 drink of alcohol per day; avoid  smoking/tobacco   Limit animal fats in diet for cholesterol and heart health - choose grass fed whenever available   Avoid highly processed foods, and foods high in saturated/trans fats   Aim for low stress - take time to unwind and care for your mental health   Aim for 150 min of moderate intensity exercise weekly for heart health, and weights twice weekly for bone health   Aim for 7-9 hours of sleep daily   When it comes to diets, agreement about the perfect plan isnt easy to find, even among the experts. Experts at the [place] developed an idea known as the Healthy Eating Plate. Just imagine a plate divided into logical, healthy portions.   The emphasis is on diet quality:   Load up on vegetables and fruits - one-half of your plate: Aim for color and variety, and remember that potatoes dont count.   Go for whole grains - one-quarter of your plate: Whole wheat, barley, wheat berries, quinoa, oats, brown rice, and foods made with them. If you want pasta, go with whole wheat pasta.   Protein power - one-quarter of your plate: Fish, chicken, beans, and nuts are all healthy, versatile protein sources. Limit red meat.   The diet, however, does go beyond the plate, offering a few other suggestions.   Use healthy plant oils, such as olive, canola, soy, corn, sunflower and peanut. Check the labels, and avoid partially hydrogenated oil, which have unhealthy trans fats.   If youre thirsty, drink water. Coffee and tea are good in moderation, but skip sugary drinks and limit milk and dairy products to one or two daily servings.   The type of carbohydrate in the diet is more important than the amount. Some sources of carbohydrates, such as vegetables, fruits, whole grains, and beans-are healthier than  others.   Finally, stay active  Signed, Berniece Salines, DO  10/10/2019 11:09 AM    [place]

## 2019-10-10 NOTE — Patient Instructions (Signed)

## 2019-11-16 ENCOUNTER — Other Ambulatory Visit: Payer: Self-pay | Admitting: Cardiovascular Disease

## 2019-12-09 ENCOUNTER — Other Ambulatory Visit: Payer: Self-pay | Admitting: Physician Assistant

## 2019-12-11 ENCOUNTER — Other Ambulatory Visit: Payer: Self-pay | Admitting: Physician Assistant

## 2019-12-17 ENCOUNTER — Other Ambulatory Visit: Payer: Self-pay | Admitting: Cardiovascular Disease

## 2019-12-30 ENCOUNTER — Other Ambulatory Visit: Payer: Self-pay | Admitting: Physician Assistant

## 2020-01-03 ENCOUNTER — Other Ambulatory Visit: Payer: Self-pay | Admitting: Physician Assistant

## 2020-01-05 NOTE — Telephone Encounter (Signed)
This is a [place] pt, Dr. Tobb 

## 2020-01-31 IMAGING — CT CT ANGIO CHEST
2 of 6 series · 18 of 46 positions shown · IV contrast (omnipaque)
Comparison: None.

CLINICAL DATA: Chest pain

EXAM:
CT ANGIOGRAPHY CHEST WITH CONTRAST
TECHNIQUE: Multidetector CT imaging of the chest was performed using the
standard protocol during bolus administration of intravenous
contrast. Multiplanar CT image reconstructions and MIPs were
obtained to evaluate the vascular anatomy.
CONTRAST:  75mL OMNIPAQUE IOHEXOL 350 MG/ML SOLN

[Series 9: thins · axial · 0.58mm/px · z∈[-238,-37]mm · 15 of 221 slices shown]
[im 10/221  lung]
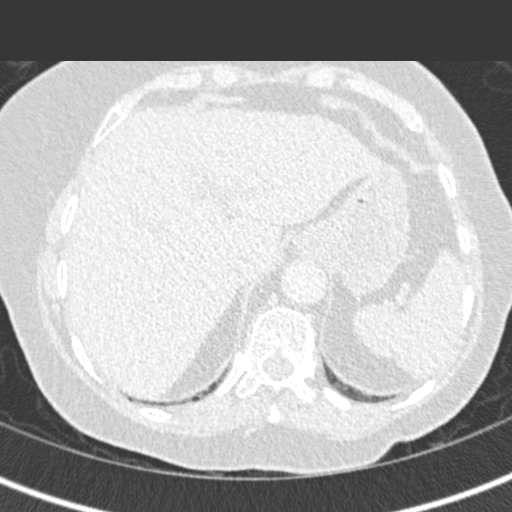
[im 29/221  soft-tissue]
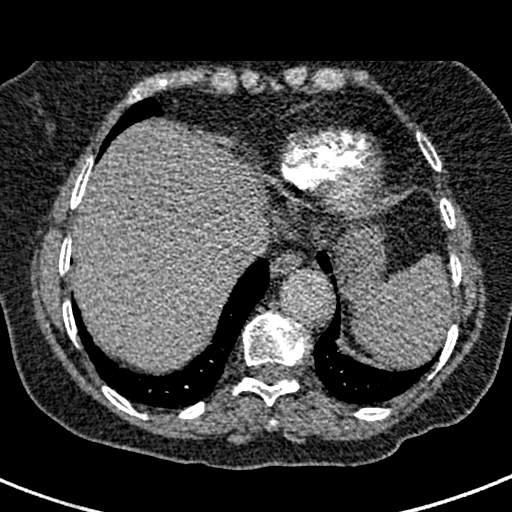
[im 39/221  lung]
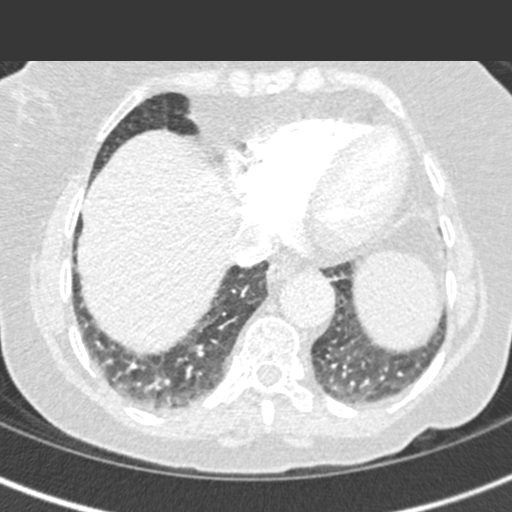
[im 58/221  soft-tissue]
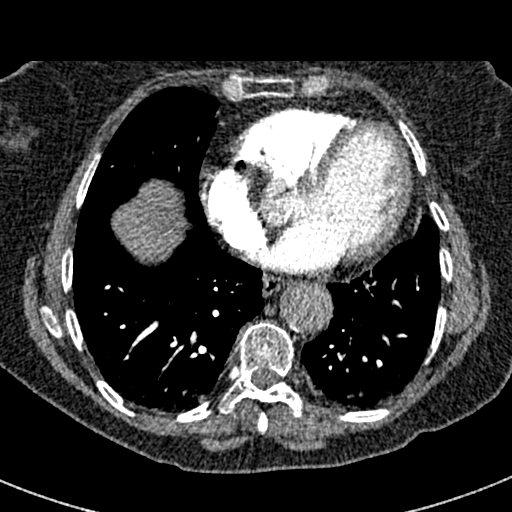
[im 67/221  lung]
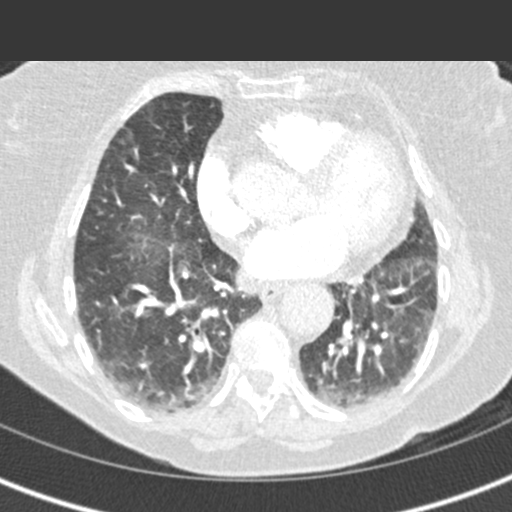
[im 87/221  soft-tissue]
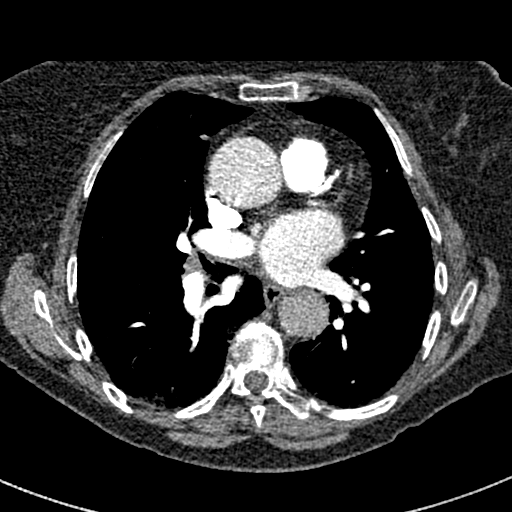
[im 96/221  lung]
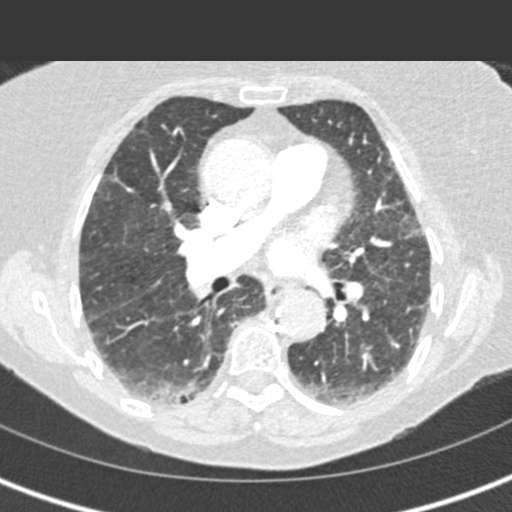
[im 115/221  soft-tissue]
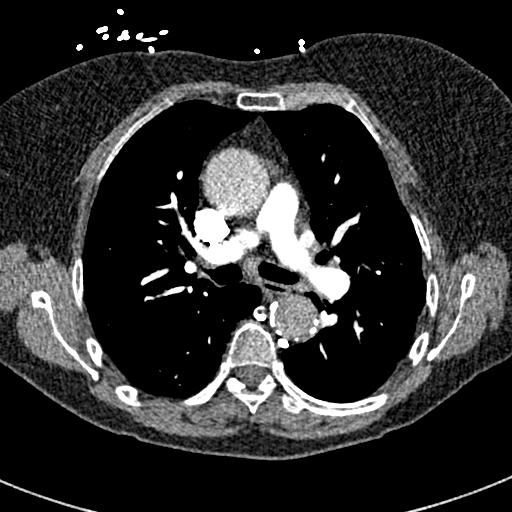
[im 125/221  lung]
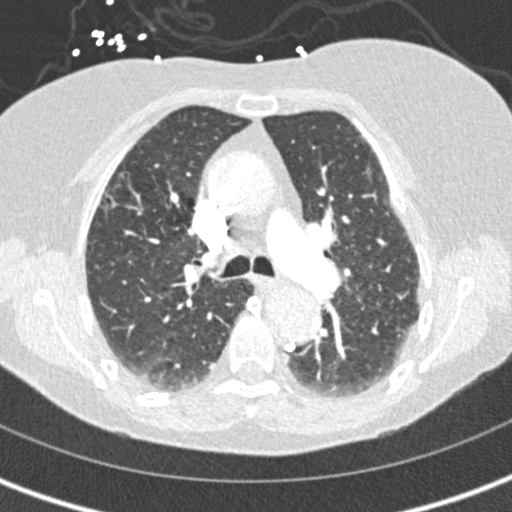
[im 134/221  soft-tissue]
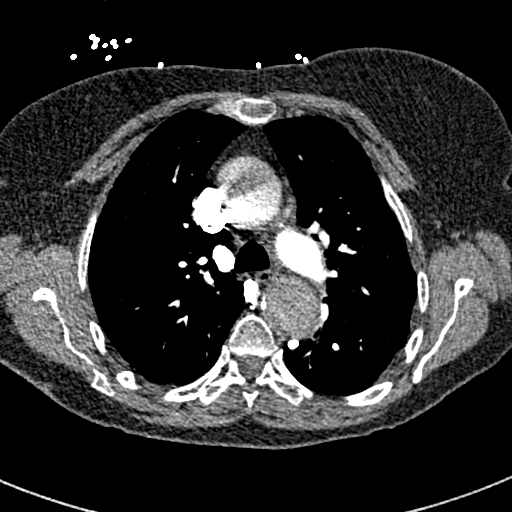
[im 154/221  lung]
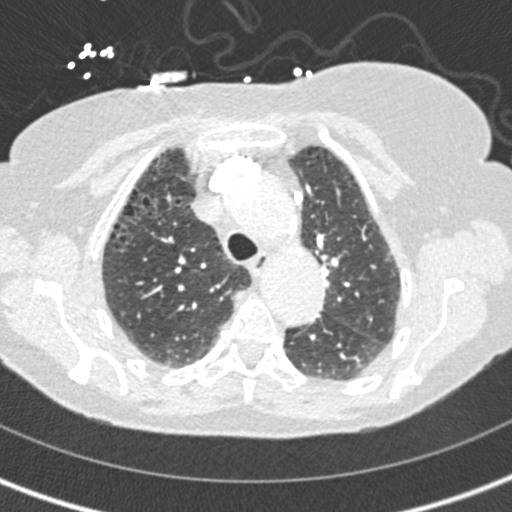
[im 163/221  soft-tissue]
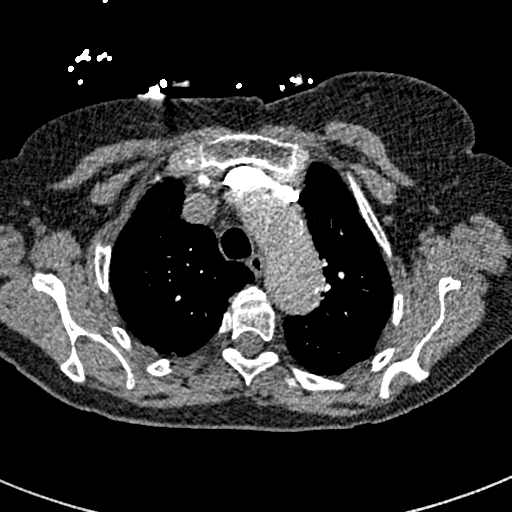
[im 182/221  lung]
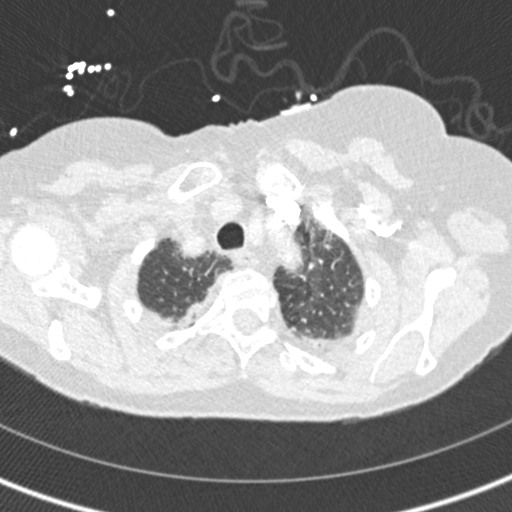
[im 192/221  soft-tissue]
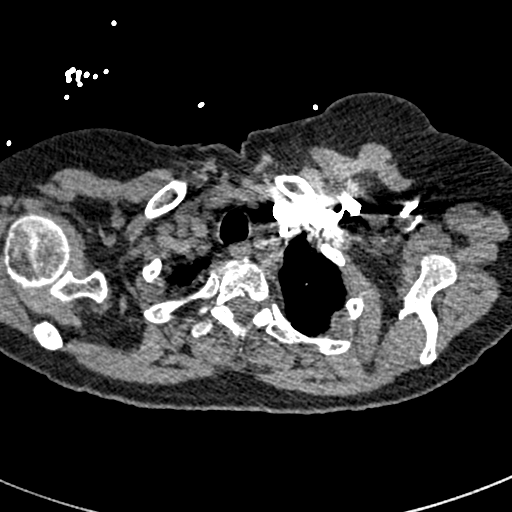
[im 211/221  lung]
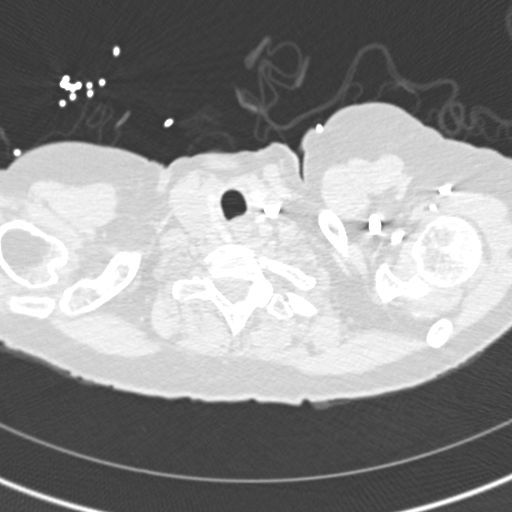

[Series 11: coronal mpr · coronal · 0.46mm/px · 3 of 123 slices shown]
[im 31/123  soft-tissue]
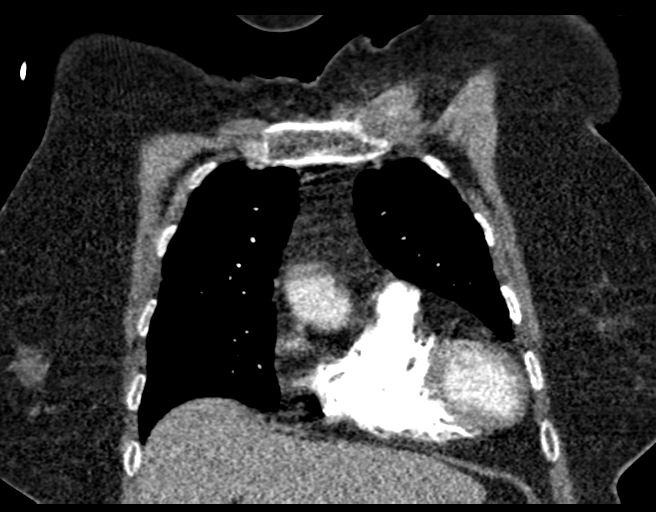
[im 62/123  soft-tissue]
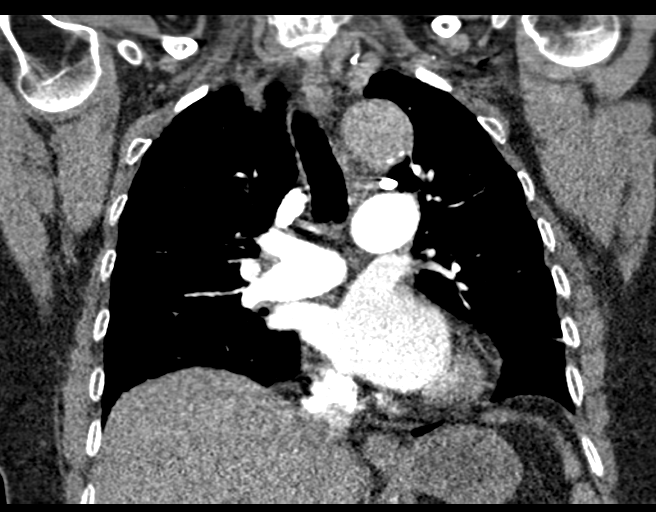
[im 92/123  soft-tissue]
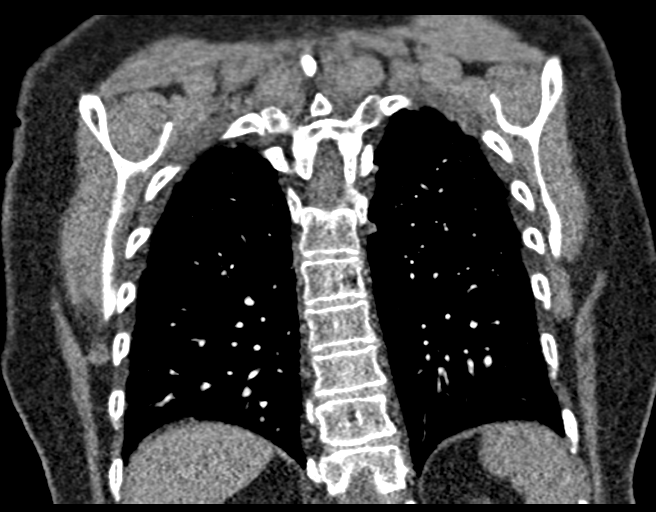

[18 of 46 positions shown; findings below may reference images not displayed]

FINDINGS: Cardiovascular: There is no acute pulmonary embolism. The heart size
is mildly enlarged. The ascending aorta is ectatic measuring
approximately 4 cm. There is no significant pericardial effusion.
Aortic calcifications are noted.

Mediastinum/Nodes:

--No mediastinal or hilar lymphadenopathy.

--No axillary lymphadenopathy.

--No supraclavicular lymphadenopathy.

--Normal thyroid gland.

--The esophagus is unremarkable

Lungs/Pleura: There are mild emphysematous changes involving the
upper lobes bilaterally. There are chronic appearing mild fibrotic
changes throughout all lobes.

Upper Abdomen: No acute abnormality.

Musculoskeletal: No chest wall abnormality. No acute or significant
osseous findings.

Review of the MIP images confirms the above findings.
IMPRESSION: 1. Negative for acute pulmonary embolism.
2. Ectasia of the ascending aorta measuring approximately 4 cm.
Recommend annual imaging followup by CTA or MRA. This recommendation
follows 7969 ACCF/AHA/AATS/ACR/ASA/SCA/DEPEW/VALDIR/GIORGI/SUNG Guidelines
for the Diagnosis and Management of Patients with Thoracic Aortic
Disease. Circulation. 7969; 121: E266-e369. Aortic aneurysm NOS
(NF53V-H7S.W)

Aortic Atherosclerosis (NF53V-Q7H.H) and Emphysema (NF53V-SAP.R).

## 2020-04-28 ENCOUNTER — Other Ambulatory Visit: Payer: Self-pay

## 2020-04-28 ENCOUNTER — Ambulatory Visit (INDEPENDENT_AMBULATORY_CARE_PROVIDER_SITE_OTHER): Payer: Medicare Other | Admitting: Cardiology

## 2020-04-28 ENCOUNTER — Encounter: Payer: Self-pay | Admitting: Cardiology

## 2020-04-28 ENCOUNTER — Ambulatory Visit: Payer: PRIVATE HEALTH INSURANCE | Admitting: Cardiology

## 2020-04-28 VITALS — BP 138/83 | HR 72 | Ht 60.0 in | Wt 125.8 lb

## 2020-04-28 DIAGNOSIS — E781 Pure hyperglyceridemia: Secondary | ICD-10-CM | POA: Diagnosis not present

## 2020-04-28 DIAGNOSIS — I2510 Atherosclerotic heart disease of native coronary artery without angina pectoris: Secondary | ICD-10-CM

## 2020-04-28 DIAGNOSIS — E782 Mixed hyperlipidemia: Secondary | ICD-10-CM

## 2020-04-28 DIAGNOSIS — I219 Acute myocardial infarction, unspecified: Secondary | ICD-10-CM | POA: Insufficient documentation

## 2020-04-28 NOTE — Progress Notes (Signed)
Cardiology Office Note:    Date:  04/28/2020   ID:  Tiffany Wright, DOB 07-04-1945, MRN [id number]  PCP:  [facility]  Cardiologist:  Berniece Salines, DO  Electrophysiologist:  None   Referring MD: Physicians, Tiffany Wright*   " I am doing well"   History of Present Illness:    Tiffany Wright is a 74 y.o. female with a hx of Lyme disease status post DES to her RCA in 2018 if the elevation MI hyperlipidemia, hypertriglyceridemia, tobacco use.  In November 2020, she was experiencing intermittent chest pain. At her visit the pain seemed atypical therefore I started patient on intermittent review her stress test from 2019 shows normal EF, no ischemia evaluation was performed. She was continued on her medication regimen which includes dual antiplatelet therapy.  In February 2021 popliteal bleeding, she received 2 units of packed red blood cells.  Her aspirin was stopped.  Plavix was continued.  And she was started on a PPI.  Seen on July 15, 2019 at that time she was posthospitalization.  Continue her PPIs as well as her Plavix.  I saw her on Oct 10, 2019 at that time she had had endoscopy which show that her ulcer had healed she was back on Protonix and was allowed to stay on her Plavix by her GI doctor.  During her visit we talked about the importance of smoking cessation.  She is here today for follow-up visit with great news she has stopped smoking.  She has not had any more bleeding.  Her recent hemoglobin which was done by her PCP was 14.  No chest pain or shortness of breath.   Past Medical History:  Diagnosis Date  . Acute blood loss anemia 07/05/2019  . Acute ST elevation myocardial infarction (STEMI) of inferior wall (HCC)   . CAD (coronary artery disease)    a. 10/2016: inferior STEMI s/p DES to RCA  . Gastrointestinal hemorrhage with melena 07/05/2019  . Hyperlipidemia   . Myocardial infarction ([place])   . Status post coronary artery stent placement   . Tobacco abuse      Past Surgical History:  Procedure Laterality Date  . BIOPSY  07/07/2019   Procedure: BIOPSY;  Surgeon: Juanita Craver, MD;  Location: [facility] ENDOSCOPY;  Service: Endoscopy;;  . CHOLECYSTECTOMY    . COLONOSCOPY    . CORONARY STENT INTERVENTION N/A 11/17/2016   Procedure: Coronary Stent Intervention;  Surgeon: Burnell Blanks, MD;  Location: [place] CV LAB;  Service: Cardiovascular;  Laterality: N/A;  . ESOPHAGOGASTRODUODENOSCOPY (EGD) WITH PROPOFOL N/A 07/07/2019   Procedure: ESOPHAGOGASTRODUODENOSCOPY (EGD) WITH PROPOFOL;  Surgeon: Juanita Craver, MD;  Location: [facility] ENDOSCOPY;  Service: Endoscopy;  Laterality: N/A;  . LEFT HEART CATH AND CORONARY ANGIOGRAPHY N/A 11/17/2016   Procedure: Left Heart Cath and Coronary Angiography;  Surgeon: Burnell Blanks, MD;  Location: [place] CV LAB;  Service: Cardiovascular;  Laterality: N/A;    Current Medications: Current Meds  Medication Sig  . atorvastatin (LIPITOR) 80 MG tablet TAKE 1 TABLET DAILY AT 6PM  . buPROPion (WELLBUTRIN XL) 150 MG 24 hr tablet Take 150 mg by mouth daily.  Marland Kitchen buPROPion (ZYBAN) 150 MG 12 hr tablet Take 150 mg by mouth daily.  . Cholecalciferol (VITAMIN D3) 5000 units TABS Take 5,000 Units by mouth at bedtime.   . clopidogrel (PLAVIX) 75 MG tablet TAKE 1 TABLET BY MOUTH EVERY DAY  . ferrous sulfate 325 (65 FE) MG EC tablet Take 325 mg by mouth  in the morning and at bedtime.  . metoprolol tartrate (LOPRESSOR) 25 MG tablet TAKE 1 TABLET TWICE A DAY  . Multiple Vitamins-Minerals (CENTRUM SILVER PO) Take 1 tablet by mouth daily.  . nitroGLYCERIN (NITROSTAT) 0.4 MG SL tablet PLACE 1 TABLET (0.4 MG TOTAL) UNDER THE TONGUE EVERY 5 (FIVE) MINUTES AS NEEDED FOR CHEST PAIN. PLEASE MAKE OVERDUE APPT WITH DR. Angelena Form. 1ST ATTEMPT  . Omega-3 Fatty Acids (OMEGA-3 FISH OIL PO) Take 690 mg by mouth. Take 2 tablets twice a day  . pantoprazole (PROTONIX) 40 MG tablet Take 40 mg by mouth daily.  . traZODone (DESYREL) 50 MG tablet  Take 50 mg by mouth daily as needed.     Allergies:   Patient has no known allergies.   Social History   Socioeconomic History  . Marital status: Married    Spouse name: Not on file  . Number of children: Not on file  . Years of education: Not on file  . Highest education level: Not on file  Occupational History  . Not on file  Tobacco Use  . Smoking status: Former Smoker    Packs/day: 1.00    Years: 30.00    Pack years: 30.00    Types: Cigarettes    Quit date: 11/09/2016    Years since quitting: 3.4  . Smokeless tobacco: Never Used  Vaping Use  . Vaping Use: Never used  Substance and Sexual Activity  . Alcohol use: No  . Drug use: No  . Sexual activity: Not on file  Other Topics Concern  . Not on file  Social History Narrative  . Not on file   Social Determinants of Health   Financial Resource Strain:   . Difficulty of Paying Living Expenses: Not on file  Food Insecurity:   . Worried About Charity fundraiser in the Last Year: Not on file  . Ran Out of Food in the Last Year: Not on file  Transportation Needs:   . Lack of Transportation (Medical): Not on file  . Lack of Transportation (Non-Medical): Not on file  Physical Activity:   . Days of Exercise per Week: Not on file  . Minutes of Exercise per Session: Not on file  Stress:   . Feeling of Stress : Not on file  Social Connections:   . Frequency of Communication with Friends and Family: Not on file  . Frequency of Social Gatherings with Friends and Family: Not on file  . Attends Religious Services: Not on file  . Active Member of Clubs or Organizations: Not on file  . Attends Archivist Meetings: Not on file  . Marital Status: Not on file     Family History: The patient's family history includes Angina in her mother; Heart attack in her brother.  ROS:   Review of Systems  Constitution: Negative for decreased appetite, fever and weight gain.  HENT: Negative for congestion, ear discharge,  hoarse voice and sore throat.   Eyes: Negative for discharge, redness, vision loss in right eye and visual halos.  Cardiovascular: Negative for chest pain, dyspnea on exertion, leg swelling, orthopnea and palpitations.  Respiratory: Negative for cough, hemoptysis, shortness of breath and snoring.   Endocrine: Negative for heat intolerance and polyphagia.  Hematologic/Lymphatic: Negative for bleeding problem. Does not bruise/bleed easily.  Skin: Negative for flushing, nail changes, rash and suspicious lesions.  Musculoskeletal: Negative for arthritis, joint pain, muscle cramps, myalgias, neck pain and stiffness.  Gastrointestinal: Negative for abdominal pain, bowel incontinence, diarrhea  and excessive appetite.  Genitourinary: Negative for decreased libido, genital sores and incomplete emptying.  Neurological: Negative for brief paralysis, focal weakness, headaches and loss of balance.  Psychiatric/Behavioral: Negative for altered mental status, depression and suicidal ideas.  Allergic/Immunologic: Negative for HIV exposure and persistent infections.    EKGs/Labs/Other Studies Reviewed:    The following studies were reviewed today:   EKG: None today  Recent Labs: 07/08/2019: ALT 19; B Natriuretic Peptide 175.5; BUN <5; Creatinine, Ser 0.68; Magnesium 1.7; Potassium 3.5; Sodium 139 07/15/2019: Hemoglobin 10.5; Platelets 455  Recent Lipid Panel    Component Value Date/Time   CHOL 130 04/04/2019 0938   TRIG 114 04/04/2019 0938   HDL 29 (L) 04/04/2019 0938   CHOLHDL 4.5 (H) 04/04/2019 0938   CHOLHDL 4.8 11/18/2016 0030   VLDL 21 11/18/2016 0030   LDLCALC 80 04/04/2019 0938    Physical Exam:    VS:  BP 138/83   Pulse 72   Ht 5' (1.524 m)   Wt 125 lb 12.8 oz (57.1 kg)   SpO2 95%   BMI 24.57 kg/m     Wt Readings from Last 3 Encounters:  04/28/20 125 lb 12.8 oz (57.1 kg)  10/10/19 138 lb (62.6 kg)  07/15/19 141 lb (64 kg)     GEN: Well nourished, well developed in no acute  distress HEENT: Normal NECK: No JVD; No carotid bruits LYMPHATICS: No lymphadenopathy CARDIAC: S1S2 noted,RRR, no murmurs, rubs, gallops RESPIRATORY:  Clear to auscultation without rales, wheezing or rhonchi  ABDOMEN: Soft, non-tender, non-distended, +bowel sounds, no guarding. EXTREMITIES: No edema, No cyanosis, no clubbing MUSCULOSKELETAL:  No deformity  SKIN: Warm and dry NEUROLOGIC:  Alert and oriented x 3, non-focal PSYCHIATRIC:  Normal affect, good insight  ASSESSMENT:    1. Mixed hyperlipidemia   2. Coronary artery disease involving native coronary artery of native heart without angina pectoris   3. Hypertriglyceridemia    PLAN:     I am going to continue patient her current medical regimen.  No anginal symptoms she will stay on her Plavix and atorvastatin. Blood pressure is acceptable in the office no changes will be made to this regimen. I congratulated patient for quitting smoking she is very happy with her decision. Her recent lab does show evidence of hypertriglyceridemia and she takes fish oil and tells me this is expensive we will consider changing this to [employer]  The patient is in agreement with the above plan. The patient left the office in stable condition.  The patient will follow up in 6 months.    Medication Adjustments/Labs and Tests Ordered: Current medicines are reviewed at length with the patient today.  Concerns regarding medicines are outlined above.  No orders of the defined types were placed in this encounter.  No orders of the defined types were placed in this encounter.   Patient Instructions  Medication Instructions:  Your physician recommends that you continue on your current medications as directed. Please refer to the Current Medication list given to you today.  *If you need a refill on your cardiac medications before your next appointment, please call your pharmacy*   Lab Work: None If you have labs (blood work) drawn today and your  tests are completely normal, you will receive your results only by: Marland Kitchen MyChart Message (if you have MyChart) OR . A paper copy in the mail If you have any lab test that is abnormal or we need to change your treatment, we will call you to review the  results.   Testing/Procedures: None   Follow-Up: At [facility], you and your health needs are our priority.  As part of our continuing mission to provide you with exceptional heart care, we have created designated Provider Care Teams.  These Care Teams include your primary Cardiologist (physician) and Advanced Practice Providers (APPs -  Physician Assistants and Nurse Practitioners) who all work together to provide you with the care you need, when you need it.  We recommend signing up for the patient portal called "MyChart".  Sign up information is provided on this After Visit Summary.  MyChart is used to connect with patients for Virtual Visits (Telemedicine).  Patients are able to view lab/test results, encounter notes, upcoming appointments, etc.  Non-urgent messages can be sent to your provider as well.   To learn more about what you can do with MyChart, go to [email or web address].    Your next appointment:   6 month(s)  The format for your next appointment:   In Person  Provider:   Berniece Salines, DO   Other Instructions      Adopting a Healthy Lifestyle.  Know what a healthy weight is for you (roughly BMI <25) and aim to maintain this   Aim for 7+ servings of fruits and vegetables daily   65-80+ fluid ounces of water or unsweet tea for healthy kidneys   Limit to max 1 drink of alcohol per day; avoid smoking/tobacco   Limit animal fats in diet for cholesterol and heart health - choose grass fed whenever available   Avoid highly processed foods, and foods high in saturated/trans fats   Aim for low stress - take time to unwind and care for your mental health   Aim for 150 min of moderate intensity exercise weekly for  heart health, and weights twice weekly for bone health   Aim for 7-9 hours of sleep daily   When it comes to diets, agreement about the perfect plan isnt easy to find, even among the experts. Experts at the [place] developed an idea known as the Healthy Eating Plate. Just imagine a plate divided into logical, healthy portions.   The emphasis is on diet quality:   Load up on vegetables and fruits - one-half of your plate: Aim for color and variety, and remember that potatoes dont count.   Go for whole grains - one-quarter of your plate: Whole wheat, barley, wheat berries, quinoa, oats, brown rice, and foods made with them. If you want pasta, go with whole wheat pasta.   Protein power - one-quarter of your plate: Fish, chicken, beans, and nuts are all healthy, versatile protein sources. Limit red meat.   The diet, however, does go beyond the plate, offering a few other suggestions.   Use healthy plant oils, such as olive, canola, soy, corn, sunflower and peanut. Check the labels, and avoid partially hydrogenated oil, which have unhealthy trans fats.   If youre thirsty, drink water. Coffee and tea are good in moderation, but skip sugary drinks and limit milk and dairy products to one or two daily servings.   The type of carbohydrate in the diet is more important than the amount. Some sources of carbohydrates, such as vegetables, fruits, whole grains, and beans-are healthier than others.   Finally, stay active  Signed, Berniece Salines, DO  04/28/2020 3:36 PM    [facility]

## 2020-04-28 NOTE — Patient Instructions (Signed)

## 2020-07-14 DIAGNOSIS — I10 Essential (primary) hypertension: Secondary | ICD-10-CM | POA: Diagnosis not present

## 2020-07-14 DIAGNOSIS — M810 Age-related osteoporosis without current pathological fracture: Secondary | ICD-10-CM | POA: Diagnosis not present

## 2020-07-14 DIAGNOSIS — I25119 Atherosclerotic heart disease of native coronary artery with unspecified angina pectoris: Secondary | ICD-10-CM | POA: Diagnosis not present

## 2020-07-14 DIAGNOSIS — L508 Other urticaria: Secondary | ICD-10-CM | POA: Diagnosis not present

## 2020-07-26 DIAGNOSIS — D171 Benign lipomatous neoplasm of skin and subcutaneous tissue of trunk: Secondary | ICD-10-CM | POA: Diagnosis not present

## 2020-07-26 DIAGNOSIS — D225 Melanocytic nevi of trunk: Secondary | ICD-10-CM | POA: Diagnosis not present

## 2020-07-26 DIAGNOSIS — D235 Other benign neoplasm of skin of trunk: Secondary | ICD-10-CM | POA: Diagnosis not present

## 2020-07-26 DIAGNOSIS — L821 Other seborrheic keratosis: Secondary | ICD-10-CM | POA: Diagnosis not present

## 2020-07-26 DIAGNOSIS — L918 Other hypertrophic disorders of the skin: Secondary | ICD-10-CM | POA: Diagnosis not present

## 2020-07-30 DIAGNOSIS — M810 Age-related osteoporosis without current pathological fracture: Secondary | ICD-10-CM | POA: Diagnosis not present

## 2020-08-11 DIAGNOSIS — Z1231 Encounter for screening mammogram for malignant neoplasm of breast: Secondary | ICD-10-CM | POA: Diagnosis not present

## 2020-09-07 ENCOUNTER — Other Ambulatory Visit: Payer: Self-pay | Admitting: Cardiology

## 2020-09-07 NOTE — Telephone Encounter (Signed)
Metoprolol approved and sent 

## 2020-09-25 DIAGNOSIS — I2510 Atherosclerotic heart disease of native coronary artery without angina pectoris: Secondary | ICD-10-CM | POA: Diagnosis not present

## 2020-09-25 DIAGNOSIS — J302 Other seasonal allergic rhinitis: Secondary | ICD-10-CM | POA: Diagnosis not present

## 2020-09-25 DIAGNOSIS — I10 Essential (primary) hypertension: Secondary | ICD-10-CM | POA: Diagnosis not present

## 2020-10-13 DIAGNOSIS — I25119 Atherosclerotic heart disease of native coronary artery with unspecified angina pectoris: Secondary | ICD-10-CM | POA: Diagnosis not present

## 2020-10-13 DIAGNOSIS — K250 Acute gastric ulcer with hemorrhage: Secondary | ICD-10-CM | POA: Diagnosis not present

## 2020-10-13 DIAGNOSIS — I10 Essential (primary) hypertension: Secondary | ICD-10-CM | POA: Diagnosis not present

## 2020-10-13 DIAGNOSIS — E782 Mixed hyperlipidemia: Secondary | ICD-10-CM | POA: Diagnosis not present

## 2020-10-15 ENCOUNTER — Other Ambulatory Visit: Payer: Self-pay | Admitting: Cardiology

## 2020-10-27 ENCOUNTER — Encounter: Payer: Self-pay | Admitting: Cardiology

## 2020-10-27 ENCOUNTER — Ambulatory Visit (INDEPENDENT_AMBULATORY_CARE_PROVIDER_SITE_OTHER): Payer: Medicare Other | Admitting: Cardiology

## 2020-10-27 ENCOUNTER — Other Ambulatory Visit: Payer: Self-pay

## 2020-10-27 VITALS — BP 122/80 | HR 73 | Ht 60.0 in | Wt 126.2 lb

## 2020-10-27 DIAGNOSIS — E781 Pure hyperglyceridemia: Secondary | ICD-10-CM | POA: Diagnosis not present

## 2020-10-27 DIAGNOSIS — E782 Mixed hyperlipidemia: Secondary | ICD-10-CM | POA: Diagnosis not present

## 2020-10-27 DIAGNOSIS — I2510 Atherosclerotic heart disease of native coronary artery without angina pectoris: Secondary | ICD-10-CM

## 2020-10-27 DIAGNOSIS — R7303 Prediabetes: Secondary | ICD-10-CM | POA: Diagnosis not present

## 2020-10-27 HISTORY — DX: Pure hyperglyceridemia: E78.1

## 2020-10-27 NOTE — Progress Notes (Signed)
Cardiology Office Note:    Date:  10/27/2020   ID:  Tiffany Wright, DOB 31-Aug-1945, MRN [id number]  PCP:  [facility]  Cardiologist:  Berniece Salines, DO  Electrophysiologist:  None   Referring MD: Physicians, Cheryll Dessert*     History of Present Illness:    Tiffany Wright is a 75 y.o. female with a hx of Lyme disease status post DES to her RCA in 2018 if the elevation MI hyperlipidemia, hypertriglyceridemia, prediabetes, tobacco use.  In November 2020, she was experiencing intermittent chest pain. At her visit the pain seemed atypical therefore I started patient on intermittent review her stress test from 2019 shows normal EF, no ischemia evaluation was performed. She was continued on her medication regimen which includes dual antiplatelet therapy.  In February 2021 popliteal bleeding, she received 2 units of packed red blood cells. Her aspirin was stopped. Plavix was continued. And she was started on a PPI.  Seen on July 15, 2019 at that time she was posthospitalization. Continue her PPIs as well as her Plavix.  I saw her on Oct 10, 2019 at that time she had had endoscopy which show that her ulcer had healed she was back on Protonix and was allowed to stay on her Plavix by her GI doctor.  During her visit we talked about the importance of smoking cessation.  I saw the patient on April 28, 2020 at that time we will continue her Plavix as well as her atorvastatin.  Tiffany Wright.  She tells me she has been doing well from a cardiovascular standpoint.  Her blood work on Oct 13, 2020 by her PCP which showed HDL 47, LDL not available, total cholesterol 137, triglyceride 87.  Past Medical History:  Diagnosis Date  . Acute blood loss anemia 07/05/2019  . Acute ST elevation myocardial infarction (STEMI) of inferior wall (HCC)   . CAD (coronary artery disease)    a. 10/2016: inferior STEMI s/p DES to RCA  . Gastrointestinal hemorrhage with melena 07/05/2019   . Hyperlipidemia   . Myocardial infarction ([place])   . Status post coronary artery stent placement   . Tobacco abuse     Past Surgical History:  Procedure Laterality Date  . BIOPSY  07/07/2019   Procedure: BIOPSY;  Surgeon: Juanita Craver, MD;  Location: [facility] ENDOSCOPY;  Service: Endoscopy;;  . CHOLECYSTECTOMY    . COLONOSCOPY    . CORONARY STENT INTERVENTION N/A 11/17/2016   Procedure: Coronary Stent Intervention;  Surgeon: Burnell Blanks, MD;  Location: [place] CV LAB;  Service: Cardiovascular;  Laterality: N/A;  . ESOPHAGOGASTRODUODENOSCOPY (EGD) WITH PROPOFOL N/A 07/07/2019   Procedure: ESOPHAGOGASTRODUODENOSCOPY (EGD) WITH PROPOFOL;  Surgeon: Juanita Craver, MD;  Location: [facility] ENDOSCOPY;  Service: Endoscopy;  Laterality: N/A;  . LEFT HEART CATH AND CORONARY ANGIOGRAPHY N/A 11/17/2016   Procedure: Left Heart Cath and Coronary Angiography;  Surgeon: Burnell Blanks, MD;  Location: [place] CV LAB;  Service: Cardiovascular;  Laterality: N/A;    Current Medications: Current Meds  Medication Sig  . atorvastatin (LIPITOR) 80 MG tablet TAKE 1 TABLET DAILY AT 6PM  . buPROPion (WELLBUTRIN XL) 150 MG 24 hr tablet Take 150 mg by mouth daily.  . Cholecalciferol (VITAMIN D3) 5000 units TABS Take 5,000 Units by mouth at bedtime.   . clopidogrel (PLAVIX) 75 MG tablet TAKE 1 TABLET BY MOUTH EVERY DAY  . ferrous sulfate 325 (65 FE) MG EC tablet Take 325 mg by mouth in the morning and at  bedtime.  . metoprolol tartrate (LOPRESSOR) 25 MG tablet TAKE 1 TABLET BY MOUTH TWICE A DAY  . Multiple Vitamins-Minerals (CENTRUM SILVER PO) Take 1 tablet by mouth daily.  . nitroGLYCERIN (NITROSTAT) 0.4 MG SL tablet PLACE 1 TABLET (0.4 MG TOTAL) UNDER THE TONGUE EVERY 5 (FIVE) MINUTES AS NEEDED FOR CHEST PAIN. PLEASE MAKE OVERDUE APPT WITH DR. Angelena Form. 1ST ATTEMPT  . Omega-3 Fatty Acids (OMEGA-3 FISH OIL PO) Take 690 mg by mouth. Take 2 tablets twice a day  . pantoprazole (PROTONIX) 40 MG tablet Take  40 mg by mouth daily.  . traZODone (DESYREL) 50 MG tablet Take 50 mg by mouth daily as needed.     Allergies:   Patient has no known allergies.   Social History   Socioeconomic History  . Marital status: Married    Spouse name: Not on file  . Number of children: Not on file  . Years of education: Not on file  . Highest education level: Not on file  Occupational History  . Not on file  Tobacco Use  . Smoking status: Former Smoker    Packs/day: 1.00    Years: 30.00    Pack years: 30.00    Types: Cigarettes    Quit date: 11/09/2016    Years since quitting: 3.9  . Smokeless tobacco: Never Used  Vaping Use  . Vaping Use: Never used  Substance and Sexual Activity  . Alcohol use: No  . Drug use: No  . Sexual activity: Not on file  Other Topics Concern  . Not on file  Social History Narrative  . Not on file   Social Determinants of Health   Financial Resource Strain: Not on file  Food Insecurity: Not on file  Transportation Needs: Not on file  Physical Activity: Not on file  Stress: Not on file  Social Connections: Not on file     Family History: The patient's family history includes Angina in her mother; Heart attack in her brother.  ROS:   Review of Systems  Constitution: Negative for decreased appetite, fever and weight gain.  HENT: Negative for congestion, ear discharge, hoarse voice and sore throat.   Eyes: Negative for discharge, redness, vision loss in right eye and visual halos.  Cardiovascular: Negative for chest pain, dyspnea on exertion, leg swelling, orthopnea and palpitations.  Respiratory: Negative for cough, hemoptysis, shortness of breath and snoring.   Endocrine: Negative for heat intolerance and polyphagia.  Hematologic/Lymphatic: Negative for bleeding problem. Does not bruise/bleed easily.  Skin: Negative for flushing, nail changes, rash and suspicious lesions.  Musculoskeletal: Negative for arthritis, joint pain, muscle cramps, myalgias, neck pain  and stiffness.  Gastrointestinal: Negative for abdominal pain, bowel incontinence, diarrhea and excessive appetite.  Genitourinary: Negative for decreased libido, genital sores and incomplete emptying.  Neurological: Negative for brief paralysis, focal weakness, headaches and loss of balance.  Psychiatric/Behavioral: Negative for altered mental status, depression and suicidal ideas.  Allergic/Immunologic: Negative for HIV exposure and persistent infections.    EKGs/Labs/Other Studies Reviewed:    The following studies were reviewed Wright:   EKG sinus rhythm, heart rate 73 bpm compared to prior EKG no significant change.   Recent Labs: No results found for requested labs within last 8760 hours.  Recent Lipid Panel    Component Value Date/Time   CHOL 130 04/04/2019 0938   TRIG 114 04/04/2019 0938   HDL 29 (L) 04/04/2019 0938   CHOLHDL 4.5 (H) 04/04/2019 0938   CHOLHDL 4.8 11/18/2016 0030   VLDL  21 11/18/2016 0030   LDLCALC 80 04/04/2019 0938    Physical Exam:    VS:  BP 122/80   Pulse 73   Ht 5' (1.524 m)   Wt 126 lb 3.2 oz (57.2 kg)   SpO2 98%   BMI 24.65 kg/m     Wt Readings from Last 3 Encounters:  10/27/20 126 lb 3.2 oz (57.2 kg)  04/28/20 125 lb 12.8 oz (57.1 kg)  10/10/19 138 lb (62.6 kg)     GEN: Well nourished, well developed in no acute distress HEENT: Normal NECK: No JVD; No carotid bruits LYMPHATICS: No lymphadenopathy CARDIAC: S1S2 noted,RRR, no murmurs, rubs, gallops RESPIRATORY:  Clear to auscultation without rales, wheezing or rhonchi  ABDOMEN: Soft, non-tender, non-distended, +bowel sounds, no guarding. EXTREMITIES: No edema, No cyanosis, no clubbing MUSCULOSKELETAL:  No deformity  SKIN: Warm and dry NEUROLOGIC:  Alert and oriented x 3, non-focal PSYCHIATRIC:  Normal affect, good insight  ASSESSMENT:    1. Coronary artery disease involving native coronary artery of native heart without angina pectoris   2. Mixed hyperlipidemia   3.  Hypertriglyceridemia   4. Prediabetes    PLAN:     She appears to be doing well from a cardiovascular standpoint.  No anginal symptoms.  Continue patient on her Plavix and atorvastatin for coronary artery disease. Insert hyperlipidemia Blood pressure is acceptable, continue with current antihypertensive regimen. Based on her recent lipid profile her triglyceride has improved. Continued smoking cessation advised  The patient is in agreement with the above plan. The patient left the office in stable condition.  The patient will follow up in 1 year   Medication Adjustments/Labs and Tests Ordered: Current medicines are reviewed at length with the patient Wright.  Concerns regarding medicines are outlined above.  No orders of the defined types were placed in this encounter.  No orders of the defined types were placed in this encounter.   There are no Patient Instructions on file for this visit.   Adopting a Healthy Lifestyle.  Know what a healthy weight is for you (roughly BMI <25) and aim to maintain this   Aim for 7+ servings of fruits and vegetables daily   65-80+ fluid ounces of water or unsweet tea for healthy kidneys   Limit to max 1 drink of alcohol per day; avoid smoking/tobacco   Limit animal fats in diet for cholesterol and heart health - choose grass fed whenever available   Avoid highly processed foods, and foods high in saturated/trans fats   Aim for low stress - take time to unwind and care for your mental health   Aim for 150 min of moderate intensity exercise weekly for heart health, and weights twice weekly for bone health   Aim for 7-9 hours of sleep daily   When it comes to diets, agreement about the perfect plan isnt easy to find, even among the experts. Experts at the [place] developed an idea known as the Healthy Eating Plate. Just imagine a plate divided into logical, healthy portions.   The emphasis is on diet quality:   Load  up on vegetables and fruits - one-half of your plate: Aim for color and variety, and remember that potatoes dont count.   Go for whole grains - one-quarter of your plate: Whole wheat, barley, wheat berries, quinoa, oats, brown rice, and foods made with them. If you want pasta, go with whole wheat pasta.   Protein power - one-quarter of your plate: Fish,  chicken, beans, and nuts are all healthy, versatile protein sources. Limit red meat.   The diet, however, does go beyond the plate, offering a few other suggestions.   Use healthy plant oils, such as olive, canola, soy, corn, sunflower and peanut. Check the labels, and avoid partially hydrogenated oil, which have unhealthy trans fats.   If youre thirsty, drink water. Coffee and tea are good in moderation, but skip sugary drinks and limit milk and dairy products to one or two daily servings.   The type of carbohydrate in the diet is more important than the amount. Some sources of carbohydrates, such as vegetables, fruits, whole grains, and beans-are healthier than others.   Finally, stay active  Signed, Berniece Salines, DO  10/27/2020 1:23 PM    [facility]

## 2020-10-27 NOTE — Patient Instructions (Signed)

## 2020-12-26 DIAGNOSIS — I2510 Atherosclerotic heart disease of native coronary artery without angina pectoris: Secondary | ICD-10-CM | POA: Diagnosis not present

## 2020-12-26 DIAGNOSIS — I10 Essential (primary) hypertension: Secondary | ICD-10-CM | POA: Diagnosis not present

## 2020-12-26 DIAGNOSIS — J302 Other seasonal allergic rhinitis: Secondary | ICD-10-CM | POA: Diagnosis not present

## 2020-12-30 ENCOUNTER — Telehealth: Payer: Self-pay

## 2020-12-30 DIAGNOSIS — Z20822 Contact with and (suspected) exposure to covid-19: Secondary | ICD-10-CM | POA: Diagnosis not present

## 2020-12-30 DIAGNOSIS — E7800 Pure hypercholesterolemia, unspecified: Secondary | ICD-10-CM | POA: Diagnosis not present

## 2020-12-30 DIAGNOSIS — R110 Nausea: Secondary | ICD-10-CM | POA: Diagnosis not present

## 2020-12-30 DIAGNOSIS — J449 Chronic obstructive pulmonary disease, unspecified: Secondary | ICD-10-CM | POA: Diagnosis not present

## 2020-12-30 DIAGNOSIS — R0600 Dyspnea, unspecified: Secondary | ICD-10-CM | POA: Diagnosis not present

## 2020-12-30 DIAGNOSIS — I252 Old myocardial infarction: Secondary | ICD-10-CM | POA: Diagnosis not present

## 2020-12-30 DIAGNOSIS — Z87891 Personal history of nicotine dependence: Secondary | ICD-10-CM | POA: Diagnosis not present

## 2020-12-30 DIAGNOSIS — R55 Syncope and collapse: Secondary | ICD-10-CM | POA: Diagnosis not present

## 2020-12-30 DIAGNOSIS — R0902 Hypoxemia: Secondary | ICD-10-CM | POA: Diagnosis not present

## 2020-12-30 DIAGNOSIS — I10 Essential (primary) hypertension: Secondary | ICD-10-CM | POA: Diagnosis not present

## 2020-12-30 DIAGNOSIS — R42 Dizziness and giddiness: Secondary | ICD-10-CM | POA: Diagnosis not present

## 2020-12-30 LAB — POCT INR: INR: 10.3 — AB (ref 2.0–3.0)

## 2020-12-30 NOTE — Telephone Encounter (Signed)
Patient called states this morning she was sweating and passed out. Blood pressure dropped to 74/49. She was seen in the Emergency Department today. She has an 8:20 appointment with PCP. "I just haven't felt myself in the last couple of months. I don't know if Dr. Harriet Masson wants to see me of what. I do not have any pain anywhere." After talking to Dr. Harriet Masson patient will hold her nightly Metoprolol tartrate and continue with her appointment in the morning. Patient will call back to give an update after seeing her PCP.

## 2020-12-31 DIAGNOSIS — R55 Syncope and collapse: Secondary | ICD-10-CM | POA: Diagnosis not present

## 2020-12-31 DIAGNOSIS — I25119 Atherosclerotic heart disease of native coronary artery with unspecified angina pectoris: Secondary | ICD-10-CM | POA: Diagnosis not present

## 2020-12-31 DIAGNOSIS — I6523 Occlusion and stenosis of bilateral carotid arteries: Secondary | ICD-10-CM | POA: Diagnosis not present

## 2020-12-31 DIAGNOSIS — E871 Hypo-osmolality and hyponatremia: Secondary | ICD-10-CM | POA: Diagnosis not present

## 2021-01-05 DIAGNOSIS — I6523 Occlusion and stenosis of bilateral carotid arteries: Secondary | ICD-10-CM | POA: Diagnosis not present

## 2021-01-05 DIAGNOSIS — E785 Hyperlipidemia, unspecified: Secondary | ICD-10-CM | POA: Diagnosis not present

## 2021-01-05 DIAGNOSIS — R55 Syncope and collapse: Secondary | ICD-10-CM | POA: Diagnosis not present

## 2021-01-06 ENCOUNTER — Encounter: Payer: Self-pay | Admitting: Cardiology

## 2021-01-07 ENCOUNTER — Other Ambulatory Visit: Payer: Self-pay

## 2021-01-07 ENCOUNTER — Encounter: Payer: Self-pay | Admitting: Cardiology

## 2021-01-07 ENCOUNTER — Ambulatory Visit (INDEPENDENT_AMBULATORY_CARE_PROVIDER_SITE_OTHER): Payer: Medicare Other | Admitting: Cardiology

## 2021-01-07 VITALS — BP 138/82 | HR 86 | Ht 60.0 in | Wt 124.0 lb

## 2021-01-07 DIAGNOSIS — E782 Mixed hyperlipidemia: Secondary | ICD-10-CM

## 2021-01-07 DIAGNOSIS — Z955 Presence of coronary angioplasty implant and graft: Secondary | ICD-10-CM | POA: Diagnosis not present

## 2021-01-07 DIAGNOSIS — I951 Orthostatic hypotension: Secondary | ICD-10-CM

## 2021-01-07 DIAGNOSIS — I2510 Atherosclerotic heart disease of native coronary artery without angina pectoris: Secondary | ICD-10-CM

## 2021-01-07 DIAGNOSIS — E781 Pure hyperglyceridemia: Secondary | ICD-10-CM

## 2021-01-07 HISTORY — DX: Orthostatic hypotension: I95.1

## 2021-01-07 NOTE — Patient Instructions (Signed)

## 2021-01-07 NOTE — Progress Notes (Signed)
Cardiology Office Note:    Date:  01/07/2021   ID:  Tiffany Wright, DOB 12/12/1945, MRN [id number]  PCP:  [name], MD  Cardiologist:  Berniece Salines, DO  Electrophysiologist:  None   Referring MD: [name], MD   Chief Complaint  Patient presents with   Loss of Consciousness    History of Present Illness:    Tiffany Wright is a 75 y.o. female with a hx of coronary artery disease status post DES to her RCA in 2018 if the elevation MI hyperlipidemia, hypertriglyceridemia, prediabetes, tobacco use.   In November 2020, she was experiencing intermittent chest pain.  At her visit the pain seemed atypical therefore I started patient on intermittent review her stress test from 2019 shows normal EF, no ischemia evaluation was performed.  She was continued on her medication regimen which includes dual antiplatelet therapy.   In February 2021 popliteal bleeding, she received 2 units of packed red blood cells.  Her aspirin was stopped.  Plavix was continued.  And she was started on a PPI.   Seen on July 15, 2019 at that time she was posthospitalization.  Continue her PPIs as well as her Plavix.   I saw her on Oct 10, 2019 at that time she had had endoscopy which show that her ulcer had healed she was back on Protonix and was allowed to stay on her Plavix by her GI doctor.  During her visit we talked about the importance of smoking cessation.   I saw the patient on April 28, 2020 at that time we will continue her Plavix as well as her atorvastatin.  I saw the patient on October 27, 2020 at that time she appeared to be doing well from a cardiovascular standpoint.  We set up for a year follow-up.  In the interim the patient had an episode where she had loss of consciousness.  It was noted to have low blood pressure because during the time of the incident her systolic blood pressures in the 70s.  We have since stopped the Lopressor.    Past Medical History:  Diagnosis Date   Acute blood  loss anemia 07/05/2019   Acute ST elevation myocardial infarction (STEMI) of inferior wall (HCC)    CAD (coronary artery disease)    a. 10/2016: inferior STEMI s/p DES to RCA   Gastrointestinal hemorrhage with melena 07/05/2019   Hyperlipidemia    Myocardial infarction [facility])    Status post coronary artery stent placement    Tobacco abuse     Past Surgical History:  Procedure Laterality Date   BIOPSY  07/07/2019   Procedure: BIOPSY;  Surgeon: Juanita Craver, MD;  Location: [place];  Service: Endoscopy;;   CHOLECYSTECTOMY     COLONOSCOPY     CORONARY STENT INTERVENTION N/A 11/17/2016   Procedure: Coronary Stent Intervention;  Surgeon: Burnell Blanks, MD;  Location: [place] CV [facility];  Service: Cardiovascular;  Laterality: N/A;   ESOPHAGOGASTRODUODENOSCOPY (EGD) WITH PROPOFOL N/A 07/07/2019   Procedure: ESOPHAGOGASTRODUODENOSCOPY (EGD) WITH PROPOFOL;  Surgeon: Juanita Craver, MD;  [facility] ENDOSCOPY;  Service: Endoscopy;  Laterality: N/A;   LEFT HEART CATH AND CORONARY ANGIOGRAPHY N/A 11/17/2016   Procedure: Left Heart Cath and Coronary Angiography;  Surgeon: Burnell Blanks, MD;  Location: [place] [facility];  Service: Cardiovascular;  Laterality: N/A;    Current Medications: Current Meds  Medication Sig   atorvastatin (LIPITOR) 80 MG tablet Take 80 mg by mouth every evening.   buPROPion (WELLBUTRIN XL) 150  MG 24 hr tablet Take 150 mg by mouth daily.   Cholecalciferol (VITAMIN D3) 5000 units TABS Take 5,000 Units by mouth at bedtime.    clopidogrel (PLAVIX) 75 MG tablet Take 75 mg by mouth daily.   ferrous sulfate 325 (65 FE) MG EC tablet Take 325 mg by mouth daily with breakfast.   Multiple Vitamins-Minerals (CENTRUM SILVER PO) Take 1 tablet by mouth daily.   nitroGLYCERIN (NITROSTAT) 0.4 MG SL tablet PLACE 1 TABLET (0.4 MG TOTAL) UNDER THE TONGUE EVERY 5 (FIVE) MINUTES AS NEEDED FOR CHEST PAIN. PLEASE MAKE OVERDUE APPT WITH DR. Angelena Form. 1ST ATTEMPT   pantoprazole  (PROTONIX) 40 MG tablet Take 40 mg by mouth 2 (two) times daily.     Allergies:   Patient has no known allergies.   Social History   Socioeconomic History   Marital status: Married    Spouse name: Not on file   Number of children: Not on file   Years of education: Not on file   Highest education level: Not on file  Occupational History   Not on file  Tobacco Use   Smoking status: Former    Packs/day: 1.00    Years: 30.00    Pack years: 30.00    Types: Cigarettes    Quit date: 11/09/2016    Years since quitting: 4.1   Smokeless tobacco: Never  Vaping Use   Vaping Use: Never used  Substance and Sexual Activity   Alcohol use: No   Drug use: No   Sexual activity: Not on file  Other Topics Concern   Not on file  Social History Narrative   Not on file   Social Determinants of Health   Financial Resource Strain: Not on file  Food Insecurity: Not on file  Transportation Needs: Not on file  Physical Activity: Not on file  Stress: Not on file  Social Connections: Not on file     Family History: The patient's family history includes Angina in her mother; Heart attack in her brother.  ROS:   Review of Systems  Constitution: Negative for decreased appetite, fever and weight gain.  HENT: Negative for congestion, ear discharge, hoarse voice and sore throat.   Eyes: Negative for discharge, redness, vision loss in right eye and visual halos.  Cardiovascular: Negative for chest pain, dyspnea on exertion, leg swelling, orthopnea and palpitations.  Respiratory: Negative for cough, hemoptysis, shortness of breath and snoring.   Endocrine: Negative for heat intolerance and polyphagia.  Hematologic/Lymphatic: Negative for bleeding problem. Does not bruise/bleed easily.  Skin: Negative for flushing, nail changes, rash and suspicious lesions.  Musculoskeletal: Negative for arthritis, joint pain, muscle cramps, myalgias, neck pain and stiffness.  Gastrointestinal: Negative for  abdominal pain, bowel incontinence, diarrhea and excessive appetite.  Genitourinary: Negative for decreased libido, genital sores and incomplete emptying.  Neurological: Negative for brief paralysis, focal weakness, headaches and loss of balance.  Psychiatric/Behavioral: Negative for altered mental status, depression and suicidal ideas.  Allergic/Immunologic: Negative for HIV exposure and persistent infections.    EKGs/Labs/Other Studies Reviewed:    The following studies were reviewed today:   EKG: None today  Recent Labs: No results found for requested labs within last 8760 hours.  Recent Lipid Panel    Component Value Date/Time   CHOL 130 04/04/2019 0938   TRIG 114 04/04/2019 0938   HDL 29 (L) 04/04/2019 0938   CHOLHDL 4.5 (H) 04/04/2019 0938   CHOLHDL 4.8 11/18/2016 0030   VLDL 21 11/18/2016 0030   LDLCALC  80 04/04/2019 0938    Physical Exam:    VS:  BP 138/82 (BP Location: Right Arm, Patient Position: Sitting, Cuff Size: Normal)   Pulse 86   Ht 5' (1.524 m)   Wt 124 lb (56.2 kg)   SpO2 97%   BMI 24.22 kg/m     Wt Readings from Last 3 Encounters:  01/07/21 124 lb (56.2 kg)  10/27/20 126 lb 3.2 oz (57.2 kg)  04/28/20 125 lb 12.8 oz (57.1 kg)     GEN: Well nourished, well developed in no acute distress HEENT: Normal NECK: No JVD; No carotid bruits LYMPHATICS: No lymphadenopathy CARDIAC: S1S2 noted,RRR, no murmurs, rubs, gallops RESPIRATORY:  Clear to auscultation without rales, wheezing or rhonchi  ABDOMEN: Soft, non-tender, non-distended, +bowel sounds, no guarding. EXTREMITIES: No edema, No cyanosis, no clubbing MUSCULOSKELETAL:  No deformity  SKIN: Warm and dry NEUROLOGIC:  Alert and oriented x 3, non-focal PSYCHIATRIC:  Normal affect, good insight  ASSESSMENT:    1. Coronary artery disease involving native coronary artery of native heart without angina pectoris   2. Mixed hyperlipidemia   3. Status post coronary artery stent placement   4.  Hypertriglyceridemia   5. Orthostatic hypotension    PLAN:     1.  She denies any anginal symptoms.  She has not had any recurrent episodes of lightheadedness or dizziness or syncope since stopping her Lopressor.  Suspect that dehydration also may have played a role in this episode. 2.  Continue her current antiplatelet and statin therapy for her coronary artery disease. 3.  Blood pressure is acceptable no changes will be made.  The patient is in agreement with the above plan. The patient left the office in stable condition.  The patient will follow up in 6 months with Dr. Geraldo Pitter, as I transition to our [place] office.   Medication Adjustments/Labs and Tests Ordered: Current medicines are reviewed at length with the patient today.  Concerns regarding medicines are outlined above.  No orders of the defined types were placed in this encounter.  No orders of the defined types were placed in this encounter.   Patient Instructions  Medication Instructions:  Your physician recommends that you continue on your current medications as directed. Please refer to the Current Medication list given to you today.  *If you need a refill on your cardiac medications before your next appointment, please call your pharmacy*   Lab Work: None If you have labs (blood work) drawn today and your tests are completely normal, you will receive your results only by: [place] (if you have MyChart) OR A paper copy in the mail If you have any lab test that is abnormal or we need to change your treatment, we will call you to review the results.   Testing/Procedures: None   Follow-Up: At [facility], you and your health needs are our priority.  As part of our continuing mission to provide you with exceptional heart care, we have created designated Provider Care Teams.  These Care Teams include your primary Cardiologist (physician) and Advanced Practice Providers (APPs -  Physician Assistants and  Nurse Practitioners) who all work together to provide you with the care you need, when you need it.  We recommend signing up for the patient portal called "MyChart".  Sign up information is provided on this After Visit Summary.  MyChart is used to connect with patients for Virtual Visits (Telemedicine).  Patients are able to view lab/test results, encounter notes, upcoming appointments, etc.  Non-urgent  messages can be sent to your provider as well.   To learn more about what you can do with MyChart, go to [email or web address].    Your next appointment:   6 month(s)  The format for your next appointment:   In Person  Provider:   Jyl Heinz, MD   Other Instructions    Adopting a Healthy Lifestyle.  Know what a healthy weight is for you (roughly BMI <25) and aim to maintain this   Aim for 7+ servings of fruits and vegetables daily   65-80+ fluid ounces of water or unsweet tea for healthy kidneys   Limit to max 1 drink of alcohol per day; avoid smoking/tobacco   Limit animal fats in diet for cholesterol and heart health - choose grass fed whenever available   Avoid highly processed foods, and foods high in saturated/trans fats   Aim for low stress - take time to unwind and care for your mental health   Aim for 150 min of moderate intensity exercise weekly for heart health, and weights twice weekly for bone health   Aim for 7-9 hours of sleep daily   When it comes to diets, agreement about the perfect plan isnt easy to find, even among the experts. Experts at the [employer] developed an idea known as the Healthy Eating Plate. Just imagine a plate divided into logical, healthy portions.   The emphasis is on diet quality:   Load up on vegetables and fruits - one-half of your plate: Aim for color and variety, and remember that potatoes dont count.   Go for whole grains - one-quarter of your plate: Whole wheat, barley, wheat berries, quinoa, oats,  brown rice, and foods made with them. If you want pasta, go with whole wheat pasta.   Protein power - one-quarter of your plate: Fish, chicken, beans, and nuts are all healthy, versatile protein sources. Limit red meat.   The diet, however, does go beyond the plate, offering a few other suggestions.   Use healthy plant oils, such as olive, canola, soy, corn, sunflower and peanut. Check the labels, and avoid partially hydrogenated oil, which have unhealthy trans fats.   If youre thirsty, drink water. Coffee and tea are good in moderation, but skip sugary drinks and limit milk and dairy products to one or two daily servings.   The type of carbohydrate in the diet is more important than the amount. Some sources of carbohydrates, such as vegetables, fruits, whole grains, and beans-are healthier than others.   Finally, stay active  Signed, Berniece Salines, DO  01/07/2021 4:19 PM    [facility]

## 2021-01-17 DIAGNOSIS — I959 Hypotension, unspecified: Secondary | ICD-10-CM | POA: Diagnosis not present

## 2021-01-17 DIAGNOSIS — K250 Acute gastric ulcer with hemorrhage: Secondary | ICD-10-CM | POA: Diagnosis not present

## 2021-01-17 DIAGNOSIS — D692 Other nonthrombocytopenic purpura: Secondary | ICD-10-CM | POA: Diagnosis not present

## 2021-01-17 DIAGNOSIS — F4321 Adjustment disorder with depressed mood: Secondary | ICD-10-CM | POA: Diagnosis not present

## 2021-01-28 DIAGNOSIS — M810 Age-related osteoporosis without current pathological fracture: Secondary | ICD-10-CM | POA: Diagnosis not present

## 2021-04-20 DIAGNOSIS — K250 Acute gastric ulcer with hemorrhage: Secondary | ICD-10-CM | POA: Diagnosis not present

## 2021-04-20 DIAGNOSIS — Z79899 Other long term (current) drug therapy: Secondary | ICD-10-CM | POA: Diagnosis not present

## 2021-04-20 DIAGNOSIS — I10 Essential (primary) hypertension: Secondary | ICD-10-CM | POA: Diagnosis not present

## 2021-04-20 DIAGNOSIS — Z0000 Encounter for general adult medical examination without abnormal findings: Secondary | ICD-10-CM | POA: Diagnosis not present

## 2021-04-20 DIAGNOSIS — I25119 Atherosclerotic heart disease of native coronary artery with unspecified angina pectoris: Secondary | ICD-10-CM | POA: Diagnosis not present

## 2021-04-20 DIAGNOSIS — F4321 Adjustment disorder with depressed mood: Secondary | ICD-10-CM | POA: Diagnosis not present

## 2021-05-15 DIAGNOSIS — M5442 Lumbago with sciatica, left side: Secondary | ICD-10-CM | POA: Diagnosis not present

## 2021-05-15 DIAGNOSIS — M5432 Sciatica, left side: Secondary | ICD-10-CM | POA: Diagnosis not present

## 2021-05-27 DIAGNOSIS — E785 Hyperlipidemia, unspecified: Secondary | ICD-10-CM | POA: Diagnosis not present

## 2021-05-27 DIAGNOSIS — I10 Essential (primary) hypertension: Secondary | ICD-10-CM | POA: Diagnosis not present

## 2021-06-03 DIAGNOSIS — N3091 Cystitis, unspecified with hematuria: Secondary | ICD-10-CM | POA: Diagnosis not present

## 2021-06-03 DIAGNOSIS — R112 Nausea with vomiting, unspecified: Secondary | ICD-10-CM | POA: Diagnosis not present

## 2021-06-03 DIAGNOSIS — K529 Noninfective gastroenteritis and colitis, unspecified: Secondary | ICD-10-CM | POA: Diagnosis not present

## 2021-06-08 DIAGNOSIS — Z20828 Contact with and (suspected) exposure to other viral communicable diseases: Secondary | ICD-10-CM | POA: Diagnosis not present

## 2021-06-08 DIAGNOSIS — N3001 Acute cystitis with hematuria: Secondary | ICD-10-CM | POA: Diagnosis not present

## 2021-06-08 DIAGNOSIS — E860 Dehydration: Secondary | ICD-10-CM | POA: Diagnosis not present

## 2021-06-08 DIAGNOSIS — M5432 Sciatica, left side: Secondary | ICD-10-CM | POA: Diagnosis not present

## 2021-06-08 DIAGNOSIS — R197 Diarrhea, unspecified: Secondary | ICD-10-CM | POA: Diagnosis not present

## 2021-06-09 DIAGNOSIS — E860 Dehydration: Secondary | ICD-10-CM | POA: Diagnosis not present

## 2021-06-09 DIAGNOSIS — Z6822 Body mass index (BMI) 22.0-22.9, adult: Secondary | ICD-10-CM | POA: Diagnosis not present

## 2021-06-09 DIAGNOSIS — N3001 Acute cystitis with hematuria: Secondary | ICD-10-CM | POA: Diagnosis not present

## 2021-06-29 DIAGNOSIS — K250 Acute gastric ulcer with hemorrhage: Secondary | ICD-10-CM | POA: Diagnosis not present

## 2021-06-29 DIAGNOSIS — N3001 Acute cystitis with hematuria: Secondary | ICD-10-CM | POA: Diagnosis not present

## 2021-06-29 DIAGNOSIS — R110 Nausea: Secondary | ICD-10-CM | POA: Diagnosis not present

## 2021-06-29 DIAGNOSIS — Z6822 Body mass index (BMI) 22.0-22.9, adult: Secondary | ICD-10-CM | POA: Diagnosis not present

## 2021-07-06 DIAGNOSIS — R110 Nausea: Secondary | ICD-10-CM | POA: Diagnosis not present

## 2021-07-06 DIAGNOSIS — K625 Hemorrhage of anus and rectum: Secondary | ICD-10-CM | POA: Diagnosis not present

## 2021-07-06 DIAGNOSIS — Z6822 Body mass index (BMI) 22.0-22.9, adult: Secondary | ICD-10-CM | POA: Diagnosis not present

## 2021-07-13 DIAGNOSIS — K250 Acute gastric ulcer with hemorrhage: Secondary | ICD-10-CM | POA: Diagnosis not present

## 2021-07-13 DIAGNOSIS — Z6822 Body mass index (BMI) 22.0-22.9, adult: Secondary | ICD-10-CM | POA: Diagnosis not present

## 2021-07-18 DIAGNOSIS — K5900 Constipation, unspecified: Secondary | ICD-10-CM | POA: Diagnosis not present

## 2021-07-18 DIAGNOSIS — R110 Nausea: Secondary | ICD-10-CM | POA: Diagnosis not present

## 2021-07-20 ENCOUNTER — Telehealth: Payer: Self-pay

## 2021-07-20 NOTE — Telephone Encounter (Signed)
° °  Pre-operative Risk Assessment    Patient Name: Tiffany Wright  DOB: 1946-05-07 MRN: [id number]      Request for Surgical Clearance    Procedure:   EGD  Date of Surgery:  Clearance 08/16/21                                 Surgeon:  Dr. Christia Reading [place] Surgeon's Group or Practice Name:  [facility] Phone number:  [phone] Fax number:  [phone]   Type of Clearance Requested:   - Pharmacy:  Hold Clopidogrel (Plavix) on Thursday 08-11-21   Type of Anesthesia:  Not Indicated   Additional requests/questions:    Gretchen Short   07/20/2021, 8:26 AM

## 2021-07-20 NOTE — Telephone Encounter (Signed)
° °  Name: Tiffany Wright  DOB: Mar 30, 1946  MRN: [id number]   Primary Cardiologist: Berniece Salines, DO  Chart reviewed as part of pre-operative protocol coverage. Patient was contacted 07/20/2021 in reference to pre-operative risk assessment for pending procedure as outlined below. She was last seen 12/2020 by Dr. Harriet Masson, primarily followed for history of CAD with prior MI/DES to RCA 2018. I reached out to patient for update on how she is doing. The patient affirms she has been doing well without any new cardiac symptoms. Therefore, based on ACC/AHA guidelines, the patient would be at acceptable risk for the planned procedure without further cardiovascular testing. The patient was advised that if she develops new symptoms prior to surgery to contact our office to arrange for a follow-up visit, and she verbalized understanding.  She was previously given clearance by Dr. Harriet Masson to hold Plavix for 5 days for a colonoscopy in 2021. As there have been no interim cardiac interventions since that time, this clearance may be applied here. We typically advise that blood thinners be resumed when felt safe by performing physician.  Will route this bundled recommendation to requesting provider via Epic fax function. Please call with questions.  Charlie Pitter, PA-C 07/20/2021, 10:07 AM

## 2021-08-11 DIAGNOSIS — M810 Age-related osteoporosis without current pathological fracture: Secondary | ICD-10-CM | POA: Diagnosis not present

## 2021-08-16 DIAGNOSIS — Z87891 Personal history of nicotine dependence: Secondary | ICD-10-CM | POA: Diagnosis not present

## 2021-08-16 DIAGNOSIS — K219 Gastro-esophageal reflux disease without esophagitis: Secondary | ICD-10-CM | POA: Diagnosis not present

## 2021-08-16 DIAGNOSIS — K257 Chronic gastric ulcer without hemorrhage or perforation: Secondary | ICD-10-CM | POA: Diagnosis not present

## 2021-08-16 DIAGNOSIS — R110 Nausea: Secondary | ICD-10-CM | POA: Diagnosis not present

## 2021-08-16 DIAGNOSIS — K449 Diaphragmatic hernia without obstruction or gangrene: Secondary | ICD-10-CM | POA: Diagnosis not present

## 2021-08-16 DIAGNOSIS — K259 Gastric ulcer, unspecified as acute or chronic, without hemorrhage or perforation: Secondary | ICD-10-CM | POA: Diagnosis not present

## 2021-08-16 DIAGNOSIS — Z7902 Long term (current) use of antithrombotics/antiplatelets: Secondary | ICD-10-CM | POA: Diagnosis not present

## 2021-08-16 DIAGNOSIS — Z955 Presence of coronary angioplasty implant and graft: Secondary | ICD-10-CM | POA: Diagnosis not present

## 2021-08-16 DIAGNOSIS — K2970 Gastritis, unspecified, without bleeding: Secondary | ICD-10-CM | POA: Diagnosis not present

## 2021-08-16 DIAGNOSIS — Z8711 Personal history of peptic ulcer disease: Secondary | ICD-10-CM | POA: Diagnosis not present

## 2021-08-26 DIAGNOSIS — M5432 Sciatica, left side: Secondary | ICD-10-CM | POA: Diagnosis not present

## 2021-08-26 DIAGNOSIS — K257 Chronic gastric ulcer without hemorrhage or perforation: Secondary | ICD-10-CM | POA: Diagnosis not present

## 2021-08-26 DIAGNOSIS — K449 Diaphragmatic hernia without obstruction or gangrene: Secondary | ICD-10-CM | POA: Diagnosis not present

## 2021-08-26 DIAGNOSIS — S76012S Strain of muscle, fascia and tendon of left hip, sequela: Secondary | ICD-10-CM | POA: Diagnosis not present

## 2021-09-07 DIAGNOSIS — M461 Sacroiliitis, not elsewhere classified: Secondary | ICD-10-CM | POA: Diagnosis not present

## 2021-09-07 DIAGNOSIS — M549 Dorsalgia, unspecified: Secondary | ICD-10-CM | POA: Diagnosis not present

## 2021-09-07 DIAGNOSIS — M545 Low back pain, unspecified: Secondary | ICD-10-CM | POA: Diagnosis not present

## 2021-09-15 ENCOUNTER — Ambulatory Visit (INDEPENDENT_AMBULATORY_CARE_PROVIDER_SITE_OTHER): Payer: Medicare Other | Admitting: Cardiology

## 2021-09-15 ENCOUNTER — Encounter: Payer: Self-pay | Admitting: Cardiology

## 2021-09-15 VITALS — BP 126/60 | HR 96 | Ht 60.0 in | Wt 123.0 lb

## 2021-09-15 DIAGNOSIS — I2510 Atherosclerotic heart disease of native coronary artery without angina pectoris: Secondary | ICD-10-CM | POA: Diagnosis not present

## 2021-09-15 DIAGNOSIS — Z720 Tobacco use: Secondary | ICD-10-CM

## 2021-09-15 DIAGNOSIS — Z955 Presence of coronary angioplasty implant and graft: Secondary | ICD-10-CM | POA: Diagnosis not present

## 2021-09-15 DIAGNOSIS — F17210 Nicotine dependence, cigarettes, uncomplicated: Secondary | ICD-10-CM

## 2021-09-15 DIAGNOSIS — E782 Mixed hyperlipidemia: Secondary | ICD-10-CM

## 2021-09-15 NOTE — Patient Instructions (Signed)
Medication Instructions:  Your physician recommends that you continue on your current medications as directed. Please refer to the Current Medication list given to you today.  *If you need a refill on your cardiac medications before your next appointment, please call your pharmacy*   Lab Work: NONE If you have labs (blood work) drawn today and your tests are completely normal, you will receive your results only by: MyChart Message (if you have MyChart) OR A paper copy in the mail If you have any lab test that is abnormal or we need to change your treatment, we will call you to review the results.   Testing/Procedures: NONE   Follow-Up: At [facility], you and your health needs are our priority.  As part of our continuing mission to provide you with exceptional heart care, we have created designated Provider Care Teams.  These Care Teams include your primary Cardiologist (physician) and Advanced Practice Providers (APPs -  Physician Assistants and Nurse Practitioners) who all work together to provide you with the care you need, when you need it.  We recommend signing up for the patient portal called "MyChart".  Sign up information is provided on this After Visit Summary.  MyChart is used to connect with patients for Virtual Visits (Telemedicine).  Patients are able to view lab/test results, encounter notes, upcoming appointments, etc.  Non-urgent messages can be sent to your provider as well.   To learn more about what you can do with MyChart, go to [email or web address].    Your next appointment:   1 year(s)  The format for your next appointment:   In Person  Provider:   Rajan Revankar, MD    Other Instructions   Important Information About Sugar       

## 2021-09-15 NOTE — Progress Notes (Signed)
?Cardiology Office Note:   ? ?Date:  09/15/2021  ? ?IDLuciel Wright, DOB 11-Oct-1945, MRN [id number] ? ?PCP:  [name], MD  ?Cardiologist:  Jenean Lindau, MD  ? ?Referring MD: [name], MD  ? ? ?ASSESSMENT:   ? ?1. Coronary artery disease involving native coronary artery of native heart without angina pectoris   ?2. Mixed hyperlipidemia   ?3. Status post coronary artery stent placement   ?4. Tobacco abuse   ? ?PLAN:   ? ?In order of problems listed above: ? ?Coronary artery disease: Secondary prevention stressed with the patient.  Importance of compliance with diet and medication stressed and she vocalized understanding.  She is asymptomatic.  She does have nitroglycerin and knows its use. ?Mixed dyslipidemia: Diet was emphasized.  Lifestyle modification urged and she promises to do better.  Lipids followed by primary care. ?Cigarette smoker: I spent 5 minutes with the patient discussing solely about smoking. Smoking cessation was counseled. I suggested to the patient also different medications and pharmacological interventions. Patient is keen to try stopping on its own at this time. He will get back to me if he needs any further assistance in this matter. ?Patient will be seen in follow-up appointment in 6 months or earlier if the patient has any concerns ? ? ? ?Medication Adjustments/Labs and Tests Ordered: ?Current medicines are reviewed at length with the patient today.  Concerns regarding medicines are outlined above.  ?No orders of the defined types were placed in this encounter. ? ?No orders of the defined types were placed in this encounter. ? ? ? ?No chief complaint on file. ?  ? ?History of Present Illness:   ? ?Tiffany Wright is a 76 y.o. female.  Patient has past medical history of coronary artery disease, mixed dyslipidemia.  She is previously unknown to me.  She has transitioned to my practice from my partner who has moved to [place].  Patient denies any problems at this time and  takes care of activities of daily living.  No chest pain orthopnea or PND.  At the time of my evaluation, the patient is alert awake oriented and in no distress. ? ?Past Medical History:  ?Diagnosis Date  ? Acute blood loss anemia 07/05/2019  ? Acute ST elevation myocardial infarction (STEMI) of inferior wall (HCC)   ? CAD (coronary artery disease)   ? a. 10/2016: inferior STEMI s/p DES to RCA  ? Gastrointestinal hemorrhage with melena 07/05/2019  ? Hyperlipidemia   ? Hypertriglyceridemia 10/27/2020  ? Myocardial infarction [facility])   ? Orthostatic hypotension 01/07/2021  ? Status post coronary artery stent placement   ? Tobacco abuse   ? ? ?Past Surgical History:  ?Procedure Laterality Date  ? BIOPSY  07/07/2019  ? Procedure: BIOPSY;  Surgeon: Juanita Craver, MD;  Location: [facility] ENDOSCOPY;  Service: Endoscopy;;  ? CHOLECYSTECTOMY    ? COLONOSCOPY    ? CORONARY STENT INTERVENTION N/A 11/17/2016  ? Procedure: Coronary Stent Intervention;  Surgeon: Burnell Blanks, MD;  Location: [place] [facility];  Service: Cardiovascular;  Laterality: N/A;  ? ESOPHAGOGASTRODUODENOSCOPY (EGD) WITH PROPOFOL N/A 07/07/2019  ? Procedure: ESOPHAGOGASTRODUODENOSCOPY (EGD) WITH PROPOFOL;  Surgeon: Juanita Craver, MD;  Location[facility] ENDOSCOPY;  Service: Endoscopy;  Laterality: N/A;  ? LEFT HEART CATH AND CORONARY ANGIOGRAPHY N/A 11/17/2016  ? Procedure: Left Heart Cath and Coronary Angiography;  Surgeon: Burnell Blanks, MD;  Location: [place] CV LAB;  Service: Cardiovascular;  Laterality: N/A;  ? ? ?Current Medications: ?Current  Meds  ?Medication Sig  ? atorvastatin (LIPITOR) 80 MG tablet Take 80 mg by mouth every evening.  ? buPROPion (WELLBUTRIN XL) 150 MG 24 hr tablet Take 150 mg by mouth daily.  ? Cholecalciferol (VITAMIN D3) 5000 units TABS Take 5,000 Units by mouth at bedtime.   ? clopidogrel (PLAVIX) 75 MG tablet Take 75 mg by mouth daily.  ? Multiple Vitamins-Minerals (CENTRUM SILVER PO) Take 1 tablet by mouth daily.  ? nitroGLYCERIN  (NITROSTAT) 0.4 MG SL tablet PLACE 1 TABLET (0.4 MG TOTAL) UNDER THE TONGUE EVERY 5 (FIVE) MINUTES AS NEEDED FOR CHEST PAIN. PLEASE MAKE OVERDUE APPT WITH DR. Angelena Form. 1ST ATTEMPT  ? pantoprazole (PROTONIX) 40 MG tablet Take 40 mg by mouth 2 (two) times daily.  ?  ? ?Allergies:   Patient has no known allergies.  ? ?Social History  ? ?Socioeconomic History  ? Marital status: Married  ?  Spouse name: Not on file  ? Number of children: Not on file  ? Years of education: Not on file  ? Highest education level: Not on file  ?Occupational History  ? Not on file  ?Tobacco Use  ? Smoking status: Former  ?  Packs/day: 1.00  ?  Years: 30.00  ?  Pack years: 30.00  ?  Types: Cigarettes  ?  Quit date: 11/09/2016  ?  Years since quitting: 4.8  ? Smokeless tobacco: Never  ?Vaping Use  ? Vaping Use: Never used  ?Substance and Sexual Activity  ? Alcohol use: No  ? Drug use: No  ? Sexual activity: Not on file  ?Other Topics Concern  ? Not on file  ?Social History Narrative  ? Not on file  ? ?Social Determinants of Health  ? ?Financial Resource Strain: Not on file  ?Food Insecurity: Not on file  ?Transportation Needs: Not on file  ?Physical Activity: Not on file  ?Stress: Not on file  ?Social Connections: Not on file  ?  ? ?Family History: ?The patient's family history includes Angina in her mother; Heart attack in her brother. ? ?ROS:   ?Please see the history of present illness.    ?All other systems reviewed and are negative. ? ?EKGs/Labs/Other Studies Reviewed:   ? ?The following studies were reviewed today: ?I discussed my findings with the patient at length.  EKG reveals sinus rhythm and nonspecific ST-T changes ? ? ?Recent Labs: ?No results found for requested labs within last 8760 hours.  ?Recent Lipid Panel ?   ?Component Value Date/Time  ? CHOL 130 04/04/2019 0938  ? TRIG 114 04/04/2019 0938  ? HDL 29 (L) 04/04/2019 3235  ? CHOLHDL 4.5 (H) 04/04/2019 5732  ? CHOLHDL 4.8 11/18/2016 0030  ? VLDL 21 11/18/2016 0030  ? [place]  80 04/04/2019 0938  ? ? ?Physical Exam:   ? ?VS:  BP 126/60   Pulse 96   Ht 5' (1.524 m)   Wt 123 lb (55.8 kg)   SpO2 98%   BMI 24.02 kg/m?    ? ?Wt Readings from Last 3 Encounters:  ?09/15/21 123 lb (55.8 kg)  ?01/07/21 124 lb (56.2 kg)  ?10/27/20 126 lb 3.2 oz (57.2 kg)  ?  ? ?GEN: Patient is in no acute distress ?HEENT: Normal ?NECK: No JVD; No carotid bruits ?LYMPHATICS: No lymphadenopathy ?CARDIAC: Hear sounds regular, 2/6 systolic murmur at the apex. ?RESPIRATORY:  Clear to auscultation without rales, wheezing or rhonchi  ?ABDOMEN: Soft, non-tender, non-distended ?MUSCULOSKELETAL:  No edema; No deformity  ?SKIN: Warm and dry ?NEUROLOGIC:  Alert and oriented x 3 ?PSYCHIATRIC:  Normal affect  ? ?Signed, ?Jenean Lindau, MD  ?09/15/2021 5:30 PM    ?[place]  ?

## 2021-09-28 DIAGNOSIS — M5442 Lumbago with sciatica, left side: Secondary | ICD-10-CM | POA: Diagnosis not present

## 2021-09-28 DIAGNOSIS — M461 Sacroiliitis, not elsewhere classified: Secondary | ICD-10-CM | POA: Diagnosis not present

## 2021-09-28 DIAGNOSIS — M545 Low back pain, unspecified: Secondary | ICD-10-CM | POA: Diagnosis not present

## 2021-10-03 DIAGNOSIS — M5442 Lumbago with sciatica, left side: Secondary | ICD-10-CM | POA: Diagnosis not present

## 2021-10-03 DIAGNOSIS — M461 Sacroiliitis, not elsewhere classified: Secondary | ICD-10-CM | POA: Diagnosis not present

## 2021-10-03 DIAGNOSIS — M545 Low back pain, unspecified: Secondary | ICD-10-CM | POA: Diagnosis not present

## 2021-10-05 DIAGNOSIS — K219 Gastro-esophageal reflux disease without esophagitis: Secondary | ICD-10-CM | POA: Diagnosis not present

## 2021-10-05 DIAGNOSIS — K5900 Constipation, unspecified: Secondary | ICD-10-CM | POA: Diagnosis not present

## 2021-10-05 DIAGNOSIS — R110 Nausea: Secondary | ICD-10-CM | POA: Diagnosis not present

## 2021-10-06 DIAGNOSIS — M461 Sacroiliitis, not elsewhere classified: Secondary | ICD-10-CM | POA: Diagnosis not present

## 2021-10-06 DIAGNOSIS — M545 Low back pain, unspecified: Secondary | ICD-10-CM | POA: Diagnosis not present

## 2021-10-06 DIAGNOSIS — M5442 Lumbago with sciatica, left side: Secondary | ICD-10-CM | POA: Diagnosis not present

## 2021-10-10 DIAGNOSIS — M545 Low back pain, unspecified: Secondary | ICD-10-CM | POA: Diagnosis not present

## 2021-10-10 DIAGNOSIS — M461 Sacroiliitis, not elsewhere classified: Secondary | ICD-10-CM | POA: Diagnosis not present

## 2021-10-10 DIAGNOSIS — M5442 Lumbago with sciatica, left side: Secondary | ICD-10-CM | POA: Diagnosis not present

## 2021-10-12 DIAGNOSIS — F4321 Adjustment disorder with depressed mood: Secondary | ICD-10-CM | POA: Diagnosis not present

## 2021-10-12 DIAGNOSIS — D692 Other nonthrombocytopenic purpura: Secondary | ICD-10-CM | POA: Diagnosis not present

## 2021-10-12 DIAGNOSIS — I25119 Atherosclerotic heart disease of native coronary artery with unspecified angina pectoris: Secondary | ICD-10-CM | POA: Diagnosis not present

## 2021-10-12 DIAGNOSIS — M5432 Sciatica, left side: Secondary | ICD-10-CM | POA: Diagnosis not present

## 2021-10-13 DIAGNOSIS — M461 Sacroiliitis, not elsewhere classified: Secondary | ICD-10-CM | POA: Diagnosis not present

## 2021-10-13 DIAGNOSIS — M545 Low back pain, unspecified: Secondary | ICD-10-CM | POA: Diagnosis not present

## 2021-10-13 DIAGNOSIS — M5442 Lumbago with sciatica, left side: Secondary | ICD-10-CM | POA: Diagnosis not present

## 2021-10-17 DIAGNOSIS — S0990XA Unspecified injury of head, initial encounter: Secondary | ICD-10-CM | POA: Diagnosis not present

## 2021-10-17 DIAGNOSIS — Z043 Encounter for examination and observation following other accident: Secondary | ICD-10-CM | POA: Diagnosis not present

## 2021-10-17 DIAGNOSIS — S0003XA Contusion of scalp, initial encounter: Secondary | ICD-10-CM | POA: Diagnosis not present

## 2021-10-17 DIAGNOSIS — M47812 Spondylosis without myelopathy or radiculopathy, cervical region: Secondary | ICD-10-CM | POA: Diagnosis not present

## 2021-10-17 DIAGNOSIS — S0083XA Contusion of other part of head, initial encounter: Secondary | ICD-10-CM | POA: Diagnosis not present

## 2021-10-17 DIAGNOSIS — R220 Localized swelling, mass and lump, head: Secondary | ICD-10-CM | POA: Diagnosis not present

## 2021-11-15 DIAGNOSIS — M461 Sacroiliitis, not elsewhere classified: Secondary | ICD-10-CM | POA: Diagnosis not present

## 2021-11-15 DIAGNOSIS — M5442 Lumbago with sciatica, left side: Secondary | ICD-10-CM | POA: Diagnosis not present

## 2021-11-15 DIAGNOSIS — M545 Low back pain, unspecified: Secondary | ICD-10-CM | POA: Diagnosis not present

## 2021-11-22 DIAGNOSIS — M461 Sacroiliitis, not elsewhere classified: Secondary | ICD-10-CM | POA: Diagnosis not present

## 2021-11-22 DIAGNOSIS — M5442 Lumbago with sciatica, left side: Secondary | ICD-10-CM | POA: Diagnosis not present

## 2021-11-22 DIAGNOSIS — M545 Low back pain, unspecified: Secondary | ICD-10-CM | POA: Diagnosis not present

## 2021-12-01 DIAGNOSIS — M461 Sacroiliitis, not elsewhere classified: Secondary | ICD-10-CM | POA: Diagnosis not present

## 2021-12-01 DIAGNOSIS — M5442 Lumbago with sciatica, left side: Secondary | ICD-10-CM | POA: Diagnosis not present

## 2021-12-01 DIAGNOSIS — M545 Low back pain, unspecified: Secondary | ICD-10-CM | POA: Diagnosis not present

## 2021-12-06 DIAGNOSIS — M545 Low back pain, unspecified: Secondary | ICD-10-CM | POA: Diagnosis not present

## 2021-12-06 DIAGNOSIS — M5442 Lumbago with sciatica, left side: Secondary | ICD-10-CM | POA: Diagnosis not present

## 2021-12-06 DIAGNOSIS — M461 Sacroiliitis, not elsewhere classified: Secondary | ICD-10-CM | POA: Diagnosis not present

## 2021-12-08 DIAGNOSIS — M461 Sacroiliitis, not elsewhere classified: Secondary | ICD-10-CM | POA: Diagnosis not present

## 2022-01-04 DIAGNOSIS — R21 Rash and other nonspecific skin eruption: Secondary | ICD-10-CM | POA: Diagnosis not present

## 2022-01-12 DIAGNOSIS — I252 Old myocardial infarction: Secondary | ICD-10-CM | POA: Diagnosis not present

## 2022-01-12 DIAGNOSIS — K5730 Diverticulosis of large intestine without perforation or abscess without bleeding: Secondary | ICD-10-CM | POA: Diagnosis not present

## 2022-01-12 DIAGNOSIS — J449 Chronic obstructive pulmonary disease, unspecified: Secondary | ICD-10-CM | POA: Diagnosis not present

## 2022-01-12 DIAGNOSIS — R531 Weakness: Secondary | ICD-10-CM | POA: Diagnosis not present

## 2022-01-12 DIAGNOSIS — R1030 Lower abdominal pain, unspecified: Secondary | ICD-10-CM | POA: Diagnosis not present

## 2022-01-12 DIAGNOSIS — R55 Syncope and collapse: Secondary | ICD-10-CM | POA: Diagnosis not present

## 2022-01-12 DIAGNOSIS — I10 Essential (primary) hypertension: Secondary | ICD-10-CM | POA: Diagnosis not present

## 2022-01-12 DIAGNOSIS — E7800 Pure hypercholesterolemia, unspecified: Secondary | ICD-10-CM | POA: Diagnosis not present

## 2022-01-12 DIAGNOSIS — R42 Dizziness and giddiness: Secondary | ICD-10-CM | POA: Diagnosis not present

## 2022-01-12 DIAGNOSIS — J439 Emphysema, unspecified: Secondary | ICD-10-CM | POA: Diagnosis not present

## 2022-01-12 DIAGNOSIS — I774 Celiac artery compression syndrome: Secondary | ICD-10-CM | POA: Diagnosis not present

## 2022-01-12 DIAGNOSIS — F17210 Nicotine dependence, cigarettes, uncomplicated: Secondary | ICD-10-CM | POA: Diagnosis not present

## 2022-01-12 DIAGNOSIS — I712 Thoracic aortic aneurysm, without rupture, unspecified: Secondary | ICD-10-CM | POA: Diagnosis not present

## 2022-02-08 DIAGNOSIS — Z6821 Body mass index (BMI) 21.0-21.9, adult: Secondary | ICD-10-CM | POA: Diagnosis not present

## 2022-02-08 DIAGNOSIS — D692 Other nonthrombocytopenic purpura: Secondary | ICD-10-CM | POA: Diagnosis not present

## 2022-02-08 DIAGNOSIS — I25119 Atherosclerotic heart disease of native coronary artery with unspecified angina pectoris: Secondary | ICD-10-CM | POA: Diagnosis not present

## 2022-02-08 DIAGNOSIS — F4321 Adjustment disorder with depressed mood: Secondary | ICD-10-CM | POA: Diagnosis not present

## 2022-03-15 DIAGNOSIS — F4321 Adjustment disorder with depressed mood: Secondary | ICD-10-CM | POA: Diagnosis not present

## 2022-03-15 DIAGNOSIS — Z6822 Body mass index (BMI) 22.0-22.9, adult: Secondary | ICD-10-CM | POA: Diagnosis not present

## 2022-03-15 DIAGNOSIS — K250 Acute gastric ulcer with hemorrhage: Secondary | ICD-10-CM | POA: Diagnosis not present

## 2022-04-06 DIAGNOSIS — J309 Allergic rhinitis, unspecified: Secondary | ICD-10-CM | POA: Diagnosis not present

## 2022-04-06 DIAGNOSIS — R0981 Nasal congestion: Secondary | ICD-10-CM | POA: Diagnosis not present

## 2022-04-06 DIAGNOSIS — H10023 Other mucopurulent conjunctivitis, bilateral: Secondary | ICD-10-CM | POA: Diagnosis not present

## 2022-04-18 DIAGNOSIS — R300 Dysuria: Secondary | ICD-10-CM | POA: Diagnosis not present

## 2022-04-26 DIAGNOSIS — N3001 Acute cystitis with hematuria: Secondary | ICD-10-CM | POA: Diagnosis not present

## 2022-04-26 DIAGNOSIS — D692 Other nonthrombocytopenic purpura: Secondary | ICD-10-CM | POA: Diagnosis not present

## 2022-04-26 DIAGNOSIS — E7439 Other disorders of intestinal carbohydrate absorption: Secondary | ICD-10-CM | POA: Diagnosis not present

## 2022-04-26 DIAGNOSIS — Z0000 Encounter for general adult medical examination without abnormal findings: Secondary | ICD-10-CM | POA: Diagnosis not present

## 2022-04-26 DIAGNOSIS — I10 Essential (primary) hypertension: Secondary | ICD-10-CM | POA: Diagnosis not present

## 2022-04-26 DIAGNOSIS — Z780 Asymptomatic menopausal state: Secondary | ICD-10-CM | POA: Diagnosis not present

## 2022-04-26 DIAGNOSIS — E782 Mixed hyperlipidemia: Secondary | ICD-10-CM | POA: Diagnosis not present

## 2022-04-26 DIAGNOSIS — I25119 Atherosclerotic heart disease of native coronary artery with unspecified angina pectoris: Secondary | ICD-10-CM | POA: Diagnosis not present

## 2022-04-26 DIAGNOSIS — I2510 Atherosclerotic heart disease of native coronary artery without angina pectoris: Secondary | ICD-10-CM | POA: Diagnosis not present

## 2022-04-26 DIAGNOSIS — Z79899 Other long term (current) drug therapy: Secondary | ICD-10-CM | POA: Diagnosis not present

## 2022-05-10 DIAGNOSIS — K250 Acute gastric ulcer with hemorrhage: Secondary | ICD-10-CM | POA: Diagnosis not present

## 2022-05-10 DIAGNOSIS — I25119 Atherosclerotic heart disease of native coronary artery with unspecified angina pectoris: Secondary | ICD-10-CM | POA: Diagnosis not present

## 2022-05-10 DIAGNOSIS — D692 Other nonthrombocytopenic purpura: Secondary | ICD-10-CM | POA: Diagnosis not present

## 2022-05-10 DIAGNOSIS — N3001 Acute cystitis with hematuria: Secondary | ICD-10-CM | POA: Diagnosis not present

## 2022-05-13 DIAGNOSIS — R300 Dysuria: Secondary | ICD-10-CM | POA: Diagnosis not present

## 2022-05-13 DIAGNOSIS — N3091 Cystitis, unspecified with hematuria: Secondary | ICD-10-CM | POA: Diagnosis not present

## 2022-05-15 ENCOUNTER — Telehealth: Payer: Self-pay

## 2022-05-15 DIAGNOSIS — I2510 Atherosclerotic heart disease of native coronary artery without angina pectoris: Secondary | ICD-10-CM

## 2022-05-15 DIAGNOSIS — Z87440 Personal history of urinary (tract) infections: Secondary | ICD-10-CM | POA: Diagnosis not present

## 2022-05-15 DIAGNOSIS — I219 Acute myocardial infarction, unspecified: Secondary | ICD-10-CM | POA: Diagnosis not present

## 2022-05-15 DIAGNOSIS — I700 Atherosclerosis of aorta: Secondary | ICD-10-CM | POA: Diagnosis not present

## 2022-05-15 DIAGNOSIS — E7800 Pure hypercholesterolemia, unspecified: Secondary | ICD-10-CM | POA: Diagnosis not present

## 2022-05-15 DIAGNOSIS — J449 Chronic obstructive pulmonary disease, unspecified: Secondary | ICD-10-CM | POA: Diagnosis not present

## 2022-05-15 DIAGNOSIS — F17210 Nicotine dependence, cigarettes, uncomplicated: Secondary | ICD-10-CM | POA: Diagnosis not present

## 2022-05-15 DIAGNOSIS — I10 Essential (primary) hypertension: Secondary | ICD-10-CM | POA: Diagnosis not present

## 2022-05-15 DIAGNOSIS — R319 Hematuria, unspecified: Secondary | ICD-10-CM | POA: Diagnosis not present

## 2022-05-15 DIAGNOSIS — R109 Unspecified abdominal pain: Secondary | ICD-10-CM | POA: Diagnosis not present

## 2022-05-15 DIAGNOSIS — S32039A Unspecified fracture of third lumbar vertebra, initial encounter for closed fracture: Secondary | ICD-10-CM | POA: Diagnosis not present

## 2022-05-15 DIAGNOSIS — S32010A Wedge compression fracture of first lumbar vertebra, initial encounter for closed fracture: Secondary | ICD-10-CM | POA: Diagnosis not present

## 2022-05-15 DIAGNOSIS — M545 Low back pain, unspecified: Secondary | ICD-10-CM | POA: Diagnosis not present

## 2022-05-16 ENCOUNTER — Telehealth: Payer: Self-pay | Admitting: *Deleted

## 2022-05-16 NOTE — Progress Notes (Signed)
  Care Coordination   Note   05/16/2022 Name: Tiffany Wright MRN: [id number] DOB: 02-25-1946  Tiffany Wright is a 76 y.o. year old female who sees [name], MD for primary care. I reached out to [name] by phone today to offer care coordination services.  Referral received   Ms. Burgin was given information about Care Coordination services today including:   The Care Coordination services include support from the care team which includes your Nurse Coordinator, Clinical Social Worker, or Pharmacist.  The Care Coordination team is here to help remove barriers to the health concerns and goals most important to you. Care Coordination services are voluntary, and the patient may decline or stop services at any time by request to their care team member.   Care Coordination Consent Status: Patient agreed to services and verbal consent obtained.   Follow up plan:  Telephone appointment with care coordination team member scheduled for:  05/16/2022  Encounter Outcome:  Pt. Scheduled from referral   Julian Hy, [place] Direct Dial: [phone]

## 2022-05-18 ENCOUNTER — Ambulatory Visit: Payer: Self-pay

## 2022-05-18 NOTE — Patient Outreach (Signed)
  Care Coordination   Initial Visit Note   05/18/2022 Name: Tiffany Wright MRN: [id number] DOB: 1945-06-09  Tiffany Wright is a 76 y.o. year old female who sees [name], MD for primary care. I spoke with  Tiffany Wright by phone today.  What matters to the patients health and wellness today?  Placed call to patient and explained the purpose of my call. Patient was confused. Reports she has a follow up with MD tomorrow about her compression fracture and pain level.  Patient reports she is having  difficulty with her land line.  Phone call was dropped. Unable to reach back.   Will ask Care guide to reach out to patient again to reschedule.   Goals Addressed   None     SDOH assessments and interventions completed:  No     Care Coordination Interventions:  No, not indicated   Follow up plan:  message sent to careguide to reschedule dropped call    Encounter Outcome:  Pt. Visit Completed   Tiffany Rand, RN, BSN, CEN [place] Coordinator [phone]

## 2022-05-19 DIAGNOSIS — M8008XA Age-related osteoporosis with current pathological fracture, vertebra(e), initial encounter for fracture: Secondary | ICD-10-CM | POA: Diagnosis not present

## 2022-05-19 DIAGNOSIS — M549 Dorsalgia, unspecified: Secondary | ICD-10-CM | POA: Diagnosis not present

## 2022-05-19 DIAGNOSIS — M4856XA Collapsed vertebra, not elsewhere classified, lumbar region, initial encounter for fracture: Secondary | ICD-10-CM | POA: Diagnosis not present

## 2022-05-24 ENCOUNTER — Ambulatory Visit
Admission: RE | Admit: 2022-05-24 | Discharge: 2022-05-24 | Disposition: A | Discharge status: Home / Self Care | Payer: Medicare Other | Source: Ambulatory Visit | Attending: Orthopedic Surgery | Admitting: Orthopedic Surgery

## 2022-05-24 ENCOUNTER — Other Ambulatory Visit: Payer: Self-pay | Admitting: Orthopedic Surgery

## 2022-05-24 DIAGNOSIS — M48061 Spinal stenosis, lumbar region without neurogenic claudication: Secondary | ICD-10-CM | POA: Diagnosis not present

## 2022-05-24 DIAGNOSIS — R29898 Other symptoms and signs involving the musculoskeletal system: Secondary | ICD-10-CM | POA: Diagnosis not present

## 2022-05-24 DIAGNOSIS — M8008XA Age-related osteoporosis with current pathological fracture, vertebra(e), initial encounter for fracture: Secondary | ICD-10-CM

## 2022-05-24 DIAGNOSIS — M545 Low back pain, unspecified: Secondary | ICD-10-CM | POA: Diagnosis not present

## 2022-05-26 ENCOUNTER — Telehealth: Payer: Self-pay | Admitting: *Deleted

## 2022-05-26 NOTE — Progress Notes (Signed)
  Care Coordination Note  05/26/2022 Name: [name] MRN: [id number] DOB: April 22, 1946  Tiffany Wright is a 76 y.o. year old female who is a primary care patient of Burgart, Anderson Malta, MD and is actively engaged with the care management team. I reached out to [name] by phone today to assist with re-scheduling an initial visit with the RN Case Manager  Follow up plan: Pt requests call back at a later date - pt is in pain and waiting on back surgery   Julian Hy, [place] Direct Dial: [phone]

## 2022-06-09 DIAGNOSIS — M8008XA Age-related osteoporosis with current pathological fracture, vertebra(e), initial encounter for fracture: Secondary | ICD-10-CM | POA: Diagnosis not present

## 2022-06-15 NOTE — Progress Notes (Signed)
Pt requests call back in 1 month - just had back surgery - will defer

## 2022-06-27 DIAGNOSIS — Z79899 Other long term (current) drug therapy: Secondary | ICD-10-CM | POA: Diagnosis not present

## 2022-06-27 DIAGNOSIS — G894 Chronic pain syndrome: Secondary | ICD-10-CM | POA: Diagnosis not present

## 2022-06-27 DIAGNOSIS — Z79891 Long term (current) use of opiate analgesic: Secondary | ICD-10-CM | POA: Diagnosis not present

## 2022-06-27 DIAGNOSIS — M8008XA Age-related osteoporosis with current pathological fracture, vertebra(e), initial encounter for fracture: Secondary | ICD-10-CM | POA: Diagnosis not present

## 2022-07-11 DIAGNOSIS — M8008XA Age-related osteoporosis with current pathological fracture, vertebra(e), initial encounter for fracture: Secondary | ICD-10-CM | POA: Diagnosis not present

## 2022-07-11 DIAGNOSIS — I252 Old myocardial infarction: Secondary | ICD-10-CM | POA: Insufficient documentation

## 2022-07-11 DIAGNOSIS — Z7189 Other specified counseling: Secondary | ICD-10-CM | POA: Diagnosis not present

## 2022-07-11 DIAGNOSIS — S32030A Wedge compression fracture of third lumbar vertebra, initial encounter for closed fracture: Secondary | ICD-10-CM | POA: Diagnosis not present

## 2022-07-17 NOTE — Progress Notes (Signed)
  Care Coordination Note  07/17/2022 Name: [name] MRN: [id number] DOB: 1946/02/26  Tiffany Wright is a 77 y.o. year old female who is a primary care patient of Burgart, Anderson Malta, MD and is actively engaged with the care management team. I reached out to [name] by phone today to assist with re-scheduling an initial visit with the RN Case Manager  Follow up plan: Telephone appointment with care management team member scheduled for: 07/20/2022  Julian Hy, [place] Direct Dial: [phone]

## 2022-07-20 ENCOUNTER — Ambulatory Visit: Payer: Self-pay

## 2022-07-20 NOTE — Patient Outreach (Signed)
  Care Coordination   07/20/2022 Name: Tiffany Wright MRN: [id number] DOB: 1945-09-24   Care Coordination Outreach Attempts:  An unsuccessful telephone outreach was attempted for a scheduled appointment today.  Follow Up Plan:  Additional outreach attempts will be made to offer the patient care coordination information and services.   Encounter Outcome:  No Answer   Care Coordination Interventions:  No, not indicated    [name], RN, BSN, [facility] [employer] [phone]

## 2022-07-27 DIAGNOSIS — M8008XA Age-related osteoporosis with current pathological fracture, vertebra(e), initial encounter for fracture: Secondary | ICD-10-CM | POA: Diagnosis not present

## 2022-07-31 ENCOUNTER — Ambulatory Visit: Payer: Self-pay

## 2022-07-31 NOTE — Patient Outreach (Signed)
  Care Coordination   Initial Visit Note   07/31/2022 Name: Tiffany Wright MRN: [id number] DOB: May 07, 1946  Tiffany Wright is a 77 y.o. year old female who sees [name], MD for primary care. I spoke with  [name] by phone today.  What matters to the patients health and wellness today?  Previous engagement via phone and lost connection.  Placed call to patient today who states that she is recovering from Kyphoplasty .  Reports that she has been told that something is wrong with her thyroid and states that she is planning to see an ENT. Reports she is on a limited budget an hopes she will be able to get medication for her osteoporosis.    Goals Addressed               This Visit's Progress     I have osteoporosis (pt-stated)        Interventions Today    Flowsheet Row Most Recent Value  Chronic Disease   Chronic disease during today's visit Other  [Compression fracture, osteoporosis]  General Interventions   General Interventions Discussed/Reviewed General Interventions Discussed, Doctor Visits  Doctor Visits Discussed/Reviewed Doctor Visits Discussed, Specialist  PCP/Specialist Visits Compliance with follow-up visit  Pharmacy Interventions   Pharmacy Dicussed/Reviewed Medications and their functions  Safety Interventions   Safety Discussed/Reviewed Fall Risk      Reviewed recent Kyphoplasty and back pain.  Reviewed patients concern for her bones.  Pending follow up with ENT.  Encouraged patient to call PCP and make an appointment.  Follow up planned for 1 month.         SDOH assessments and interventions completed:  Yes  SDOH Interventions Today    Flowsheet Row Most Recent Value  SDOH Interventions   Food Insecurity Interventions Intervention Not Indicated  Housing Interventions Intervention Not Indicated  Transportation Interventions Intervention Not Indicated  Utilities Interventions Intervention Not Indicated  Alcohol Usage Interventions Intervention Not  Indicated (Score <7)  Financial Strain Interventions Intervention Not Indicated  Physical Activity Interventions Intervention Not Indicated  [currently recovering from back surgery]  Stress Interventions Intervention Not Indicated  Social Connections Interventions Intervention Not Indicated        Care Coordination Interventions:  Yes, provided   Follow up plan: Follow up call scheduled for 08/30/2022    Encounter Outcome:  Pt. Visit Completed   [name], RN, BSN, CEN [place] Coordinator [phone]

## 2022-08-04 DIAGNOSIS — M4850XA Collapsed vertebra, not elsewhere classified, site unspecified, initial encounter for fracture: Secondary | ICD-10-CM | POA: Diagnosis not present

## 2022-08-04 DIAGNOSIS — L84 Corns and callosities: Secondary | ICD-10-CM | POA: Diagnosis not present

## 2022-08-04 DIAGNOSIS — E213 Hyperparathyroidism, unspecified: Secondary | ICD-10-CM | POA: Diagnosis not present

## 2022-08-16 DIAGNOSIS — Z681 Body mass index (BMI) 19 or less, adult: Secondary | ICD-10-CM | POA: Diagnosis not present

## 2022-08-16 DIAGNOSIS — E213 Hyperparathyroidism, unspecified: Secondary | ICD-10-CM | POA: Diagnosis not present

## 2022-08-16 DIAGNOSIS — N3001 Acute cystitis with hematuria: Secondary | ICD-10-CM | POA: Diagnosis not present

## 2022-08-16 DIAGNOSIS — M62830 Muscle spasm of back: Secondary | ICD-10-CM | POA: Diagnosis not present

## 2022-08-17 DIAGNOSIS — E213 Hyperparathyroidism, unspecified: Secondary | ICD-10-CM | POA: Diagnosis not present

## 2022-08-29 DIAGNOSIS — M8008XA Age-related osteoporosis with current pathological fracture, vertebra(e), initial encounter for fracture: Secondary | ICD-10-CM | POA: Insufficient documentation

## 2022-08-30 ENCOUNTER — Ambulatory Visit: Payer: Self-pay

## 2022-08-30 NOTE — Patient Outreach (Signed)
  Care Coordination   Follow Up Visit Note   08/30/2022 Name: Tiffany Wright MRN: [id number] DOB: 11/01/1945  Tiffany Wright is a 77 y.o. year old female who sees [name], MD for primary care. I spoke with  [name] by phone today.  What matters to the patients health and wellness today?  Patient reports that she continues to have back pain. 4/10 today.  Reports she is taking tylenol for pain.  States that she has testing scheduled in [facility] on Friday.      Goals Addressed               This Visit's Progress     I have osteoporosis (pt-stated)        Interventions Today    Flowsheet Row Most Recent Value  Chronic Disease   Chronic disease during today's visit Other  [osteoporosis, high calicum level]  General Interventions   General Interventions Discussed/Reviewed General Interventions Reviewed, Doctor Visits  Doctor Visits Discussed/Reviewed Doctor Visits Reviewed, Specialist  [reviewed ENT and ortho visits.]  Exercise Interventions   Exercise Discussed/Reviewed --  [repors she is back to work]  Pharmacy Interventions   Pharmacy Dicussed/Reviewed Medications and their functions  [reports that she thinks she will be put on a medications that will be expensive and she is concerned about it.  Reviewed with patient lets see what the plan is and I can refer her to the pharmacy team if needed.]  Safety Interventions   Safety Discussed/Reviewed Home Safety  [Reviewed importance of be safe at home and avoiding falls.]      Reviewed parathyroid testing on 09/01/2022 in [facility].  Follow up planned for 2 weeks.         SDOH assessments and interventions completed:  No     Care Coordination Interventions:  Yes, provided   Follow up plan: Follow up call scheduled for 09/13/2022    Encounter Outcome:  Pt. Visit Completed   [name], RN, BSN, CEN [place] Coordinator [phone] \

## 2022-09-01 DIAGNOSIS — D351 Benign neoplasm of parathyroid gland: Secondary | ICD-10-CM | POA: Diagnosis not present

## 2022-09-01 DIAGNOSIS — I7121 Aneurysm of the ascending aorta, without rupture: Secondary | ICD-10-CM | POA: Diagnosis not present

## 2022-09-01 DIAGNOSIS — I700 Atherosclerosis of aorta: Secondary | ICD-10-CM | POA: Diagnosis not present

## 2022-09-01 DIAGNOSIS — J439 Emphysema, unspecified: Secondary | ICD-10-CM | POA: Diagnosis not present

## 2022-09-01 DIAGNOSIS — E213 Hyperparathyroidism, unspecified: Secondary | ICD-10-CM | POA: Diagnosis not present

## 2022-09-01 DIAGNOSIS — I2510 Atherosclerotic heart disease of native coronary artery without angina pectoris: Secondary | ICD-10-CM | POA: Diagnosis not present

## 2022-09-04 DIAGNOSIS — R918 Other nonspecific abnormal finding of lung field: Secondary | ICD-10-CM | POA: Insufficient documentation

## 2022-09-07 DIAGNOSIS — E213 Hyperparathyroidism, unspecified: Secondary | ICD-10-CM | POA: Diagnosis not present

## 2022-09-13 ENCOUNTER — Ambulatory Visit: Payer: Self-pay

## 2022-09-13 DIAGNOSIS — I25119 Atherosclerotic heart disease of native coronary artery with unspecified angina pectoris: Secondary | ICD-10-CM | POA: Diagnosis not present

## 2022-09-13 DIAGNOSIS — R911 Solitary pulmonary nodule: Secondary | ICD-10-CM | POA: Diagnosis not present

## 2022-09-13 DIAGNOSIS — D692 Other nonthrombocytopenic purpura: Secondary | ICD-10-CM | POA: Diagnosis not present

## 2022-09-13 DIAGNOSIS — D351 Benign neoplasm of parathyroid gland: Secondary | ICD-10-CM | POA: Diagnosis not present

## 2022-09-13 NOTE — Patient Outreach (Signed)
  Care Coordination   Follow Up Visit Note   09/13/2022 Name: Tiffany Wright MRN: [id number] DOB: April 03, 1946  Tiffany Wright is a 77 y.o. year old female who sees Buckner Malta, MD for primary care. I spoke with  Tiffany Wright by phone today.  What matters to the patients health and wellness today?  Follow up call with patient today. Patient reports that her back is doing well. Reports that she takes tylenol if needed for pain. Continues to work 4 days per week.  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  Has a CT planned for tomorrow for evaluation of lungs.  Has PCP follow up planned for today.  Denies any changes to medications.  Sounds like she is in good spirits.     Goals Addressed               This Visit's Progress     I have osteoporosis (pt-stated)        Interventions Today    Flowsheet Row Most Recent Value  Chronic Disease   Chronic disease during today's visit Other  [osteoporosis]  General Interventions   General Interventions Discussed/Reviewed General Interventions Discussed, Labs, Doctor Visits  Doctor Visits Discussed/Reviewed Doctor Visits Discussed, Doctor Visits Reviewed  [Reviewed EMR records from [facility] and radiology reports and MD reports.]  Education Interventions   Education Provided Provided Education  [Encouraged patient to ask questions if she does not understand. Encouraged patient to take a family member to her appointments.]  Mental Health Interventions   Mental Health Discussed/Reviewed Other  Tiffany Wright how patient is feeling about her new diagnosis.  Patient reports that she has given it to GOD and will take one day at a time.  Provided support and a listening ear.]  Nutrition Interventions   Nutrition Discussed/Reviewed Nutrition Discussed  Pharmacy Interventions   Pharmacy Dicussed/Reviewed Medications and their functions  [Reviewed with patient that she has applied for assistance with her medications.]      Appointment  for follow up in 2 weeks. Encouraged patient to call me sooner if needed.  Reviewed that patient has my contact information.        SDOH assessments and interventions completed:  No     Care Coordination Interventions:  Yes, provided   Follow up plan: Follow up call scheduled for 09/27/2022    Encounter Outcome:  Pt. Visit Completed   Rowe Pavy, RN, BSN, CEN [name] Coordinator [phone]

## 2022-09-14 ENCOUNTER — Ambulatory Visit: Payer: Medicare Other | Admitting: Cardiology

## 2022-09-14 DIAGNOSIS — J439 Emphysema, unspecified: Secondary | ICD-10-CM | POA: Diagnosis not present

## 2022-09-14 DIAGNOSIS — I7121 Aneurysm of the ascending aorta, without rupture: Secondary | ICD-10-CM | POA: Diagnosis not present

## 2022-09-14 DIAGNOSIS — I700 Atherosclerosis of aorta: Secondary | ICD-10-CM | POA: Diagnosis not present

## 2022-09-14 DIAGNOSIS — R918 Other nonspecific abnormal finding of lung field: Secondary | ICD-10-CM | POA: Diagnosis not present

## 2022-09-15 ENCOUNTER — Encounter: Payer: Self-pay | Admitting: Cardiology

## 2022-09-15 ENCOUNTER — Encounter: Payer: Self-pay | Admitting: *Deleted

## 2022-09-18 NOTE — Progress Notes (Unsigned)
Cardiology Office Note:    Date:  09/19/2022   ID:  Tiffany Wright, DOB 1946-03-02, MRN [id number]  PCP:  Buckner Malta, MD   [facility] Providers Cardiologist:  Garwin Brothers, MD     Referring MD: Buckner Malta, MD   CC: preoperative evaluation  History of Present Illness:    Tiffany Wright is a 77 y.o. female with a hx of CAD (inferior STEMI s/p DES to RCA 2018), lung nodules, history of GI bleed, hyperparathyroidism, hyperlipidemia, tobacco abuse.  She was admitted on 11/17/2016 to 11/19/2016 for anterior STEMI. LHC in 2018 revealed multivessel CAD, s/p P PTCA/DES x 1 to proximal RCA.  Echo at this time revealed an EF 55 to 60%, no RWMA, mild aortic valve regurgitation.  MPI in 2019 was negative for ischemia overall normal low risk study.  CT angio of chest in 2020 revealed ascending aortic dilatation measuring 4 cm.  Most recently evaluated in the office by Dr. Tomie China on 09/15/2021, she was doing well from a cardiac perspective at this time.  She continues to smoke however was trying to decrease her amount.  She was advised to follow-up in a year.  She was evaluated on 09/07/2022 by Dr. Verne Spurr for consideration of parathyroidectomy.  During her workup she had a CT of her chest which revealed multiple small pulmonary nodules, that had not changed significantly in size since 2023, recommendations for follow-up and in 12 months.  Her ascending thoracic aorta was evaluated at this time as well, showing stable aneurysm at 4.0 cm.  She was referred to pulmonology for further evaluation of her nodules.  She presents today for follow-up of her CAD, and preoperative evaluation.  She denies angina or acute decompensation.  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 She denies chest pain, palpitations, dyspnea, pnd, orthopnea, n, v, dizziness, syncope, edema, weight gain, or early satiety.    Past Medical History:  Diagnosis  Date   Acute blood loss anemia 07/05/2019   Acute ST elevation myocardial infarction (STEMI) of inferior wall    CAD (coronary artery disease)    a. 10/2016: inferior STEMI s/p DES to RCA   Essential hypertension    Gastrointestinal hemorrhage with melena 07/05/2019   Hyperlipidemia    Hypertriglyceridemia 10/27/2020   Myocardial infarction    Orthostatic hypotension 01/07/2021   Seasonal allergies    Status post coronary artery stent placement    Tobacco abuse     Past Surgical History:  Procedure Laterality Date   BIOPSY  07/07/2019   Procedure: BIOPSY;  Surgeon: Charna Elizabeth, MD;  Location: [facility] ENDOSCOPY;  Service: Endoscopy;;   CATARACT EXTRACTION Bilateral    CHOLECYSTECTOMY     COLONOSCOPY     CORONARY STENT INTERVENTION N/A 11/17/2016   Procedure: Coronary Stent Intervention;  Surgeon: Kathleene Hazel, MD;  Location: [facility] INVASIVE CV LAB;  Service: Cardiovascular;  Laterality: N/A;   ESOPHAGOGASTRODUODENOSCOPY (EGD) WITH PROPOFOL N/A 07/07/2019   Procedure: ESOPHAGOGASTRODUODENOSCOPY (EGD) WITH PROPOFOL;  Surgeon: Charna Elizabeth, MD;  Location: [facility] ENDOSCOPY;  Service: Endoscopy;  Laterality: N/A;   LEFT HEART CATH AND CORONARY ANGIOGRAPHY N/A 11/17/2016   Procedure: Left Heart Cath and Coronary Angiography;  Surgeon: Kathleene Hazel, MD;  Location: [facility] INVASIVE CV LAB;  Service: Cardiovascular;  Laterality: N/A;   VAGINAL HYSTERECTOMY      Current Medications: Current Meds  Medication Sig   atorvastatin (LIPITOR) 80 MG tablet Take 80 mg by mouth every evening.  buPROPion (WELLBUTRIN XL) 150 MG 24 hr tablet Take 300 mg by mouth daily.   calcitonin, salmon, (MIACALCIN/FORTICAL) 200 UNIT/ACT nasal spray Place 1 spray into alternate nostrils daily.   Cholecalciferol (VITAMIN D3) 5000 units TABS Take 2,000 Units by mouth at bedtime.   clopidogrel (PLAVIX) 75 MG tablet Take 75 mg by mouth daily.   diclofenac Sodium (VOLTAREN) 1 % GEL Apply 2 g topically 2 (two)  times daily. Until symptoms improve   famotidine (PEPCID) 40 MG tablet Take 40 mg by mouth daily.   Multiple Vitamins-Minerals (CENTRUM SILVER PO) Take 1 tablet by mouth daily.   nitroGLYCERIN (NITROSTAT) 0.4 MG SL tablet Place 0.4 mg under the tongue every 5 (five) minutes as needed for chest pain.     Allergies:   Patient has no known allergies.   Social History   Socioeconomic History   Marital status: Married    Spouse name: Not on file   Number of children: Not on file   Years of education: Not on file   Highest education level: Not on file  Occupational History   Not on file  Tobacco Use   Smoking status: Former    Packs/day: 1.00    Years: 30.00    Additional pack years: 0.00    Total pack years: 30.00    Types: Cigarettes    Quit date: 04/2022    Years since quitting: 0.3   Smokeless tobacco: Never  Vaping Use   Vaping Use: Never used  Substance and Sexual Activity   Alcohol use: No   Drug use: No   Sexual activity: Not on file  Other Topics Concern   Not on file  Social History Narrative   Not on file   Social Determinants of Health   Financial Resource Strain: Medium Risk (07/31/2022)   Overall Financial Resource Strain (CARDIA)    Difficulty of Paying Living Expenses: Somewhat hard  Food Insecurity: No Food Insecurity (07/31/2022)   Hunger Vital Sign    Worried About Running Out of Food in the Last Year: Never true    Ran Out of Food in the Last Year: Never true  Transportation Needs: No Transportation Needs (07/31/2022)   PRAPARE - Administrator, Civil Service (Medical): No    Lack of Transportation (Non-Medical): No  Physical Activity: Unknown (07/31/2022)   Exercise Vital Sign    Days of Exercise per Week: 0 days    Minutes of Exercise per Session: Not on file  Stress: Stress Concern Present (07/31/2022)   [employer] of Occupational Health - Occupational Stress Questionnaire    Feeling of Stress : To some extent  Social Connections:  Moderately Isolated (07/31/2022)   Social Connection and Isolation Panel [NHANES]    Frequency of Communication with Friends and Family: More than three times a week    Frequency of Social Gatherings with Friends and Family: More than three times a week    Attends Religious Services: Never    Database administrator or Organizations: No    Attends Engineer, structural: Never    Marital Status: Married     Family History: The patient's family history includes Angina in her mother; Heart attack in her brother; Hypertension in her mother.  ROS:   Please see the history of present illness.     All other systems reviewed and are negative.  EKGs/Labs/Other Studies Reviewed:    The following studies were reviewed today: Cardiac Studies & Procedures   CARDIAC CATHETERIZATION  CARDIAC CATHETERIZATION 11/17/2016  Narrative  Mid Cx lesion, 50 %stenosed.  Ost Cx to Prox Cx lesion, 20 %stenosed.  Prox Cx to Mid Cx lesion, 50 %stenosed.  Prox LAD to Mid LAD lesion, 50 %stenosed.  Ost 1st Diag to 1st Diag lesion, 40 %stenosed.  Dist LAD lesion, 20 %stenosed.  Mid RCA lesion, 60 %stenosed.  A STENT RESOLUTE ONYX 4.5X18 drug eluting stent was successfully placed.  Prox RCA lesion, 99 %stenosed.  Post intervention, there is a 0% residual stenosis.  The left ventricular systolic function is normal.  LV end diastolic pressure is normal.  The left ventricular ejection fraction is greater than 65% by visual estimate.  There is no mitral valve regurgitation.  1. Acute inferior STEMI secondary to severe stenosis proximal RCA 2. Successful PTCA/DES x 1 proximal RCA 3. Moderate stenosis mid LAD 4. Moderate stenosis distal Circumflex 5. Moderate stenosis mid RCA 6. Normal LV systolic function  Recommendations: Will plan one year of DAPT with ASA/Brilinta. Will start high dose statin. Will start beta blocker. Cycle troponin. Echo tomorrow. Fast track out of ICU tomorrow if  stable. Plan for medical management of moderate residual disease in the LAD, Circumflex and RCA. Smoking cessation.  Findings Coronary Findings Diagnostic  Dominance: Right  Left Anterior Descending  First Diagonal Branch Vessel is small in size.  First Septal Branch Vessel is small in size.  Second Diagonal Branch Vessel is small in size.  Second Septal Branch Vessel is small in size.  Ramus Intermedius Vessel is small.  Left Circumflex Vessel is moderate in size.  First Obtuse Marginal Branch Vessel is small in size.  Second Obtuse Marginal Branch Vessel is small in size.  Right Coronary Artery The lesion is ulcerative.  Intervention  Prox RCA lesion Angioplasty Lesion crossed with guidewire. Pre-stent angioplasty was performed using a BALLOON EUPHORA [id number]. A STENT RESOLUTE ONYX 4.5X18 drug eluting stent was successfully placed. Stent strut is well apposed. Post-stent angioplasty was performed using a BALLOON [place] EUPHORA [id number]. The pre-interventional distal flow is decreased (TIMI 2).  The post-interventional distal flow is normal (TIMI 3). The intervention was successful . No complications occurred at this lesion. There is a 0% residual stenosis post intervention.   STRESS TESTS  MYOCARDIAL PERFUSION IMAGING 10/24/2017  Narrative  Nuclear stress EF: 75%. The left ventricular ejection fraction is hyperdynamic (>65%).  The patient walked for a total of 5 minutes on a Bruce protocol treadmill test. Patient achieved a peak heart rate of 141 which is 94% predicted maximal heart rate. There were no ST or T wave changes to suggest ischemia. The blood pressure response to exercise was normal.  The study is normal. No evidence of ischemia. There is no evidence of previous infarction.  This is a low risk study.   ECHOCARDIOGRAM  ECHOCARDIOGRAM COMPLETE 11/18/2016  Narrative *[place]* *[facility]* [address] [place], [place]  [address] [phone]  ------------------------------------------------------------------- Transthoracic Echocardiography  Patient:    Nadja, Lina MR #:       [id number] Study Date: 11/18/2016 Gender:     F Age:        50 Height:     152.4 cm Weight:     58.6 kg BSA:        1.59 m^2 Pt. Status: Room:       2W12C  SONOGRAPHER  Delcie Roch, RDCS, CCT ADMITTING    Verne Carrow ATTENDING    [place], Jennette Dubin REFERRING  [place], Christopher PERFORMING   [facility], Inpatient  cc:  ------------------------------------------------------------------- LV EF: 55% -   60%  ------------------------------------------------------------------- Indications:      CAD of native vessels 414.01.  ------------------------------------------------------------------- History:   Risk factors:  STEMI.  ------------------------------------------------------------------- Study Conclusions  - Left ventricle: The cavity size was normal. Systolic function was normal. The estimated ejection fraction was in the range of 55% to 60%. Wall motion was normal; there were no regional wall motion abnormalities. - Aortic valve: There was mild regurgitation. - Atrial septum: No defect or patent foramen ovale was identified.  ------------------------------------------------------------------- Study data:  No prior study was available for comparison.  Study status:  Routine.  Procedure:  The patient reported no pain pre or post test. Transthoracic echocardiography. Image quality was fair. Study completion:  There were no complications. Transthoracic echocardiography.  M-mode, complete 2D, spectral Doppler, and color Doppler.  Birthdate:  Patient birthdate: [birth year].  Age:  Patient is 77 yr old.  Sex:  Gender: female. BMI: 25.2 kg/m^2.  Blood pressure:     130/56  Patient status: Inpatient.  Study date:  Study date: 11/18/2016. Study time: 08:21 PM.   Location:  Echo laboratory.  -------------------------------------------------------------------  ------------------------------------------------------------------- Left ventricle:  The cavity size was normal. Systolic function was normal. The estimated ejection fraction was in the range of 55% to 60%. Wall motion was normal; there were no regional wall motion abnormalities.  ------------------------------------------------------------------- Aortic valve:   Trileaflet; mildly thickened, mildly calcified leaflets. Mobility was not restricted.  Doppler:  Transvalvular velocity was within the normal range. There was no stenosis. There was mild regurgitation.  ------------------------------------------------------------------- Aorta:  The aorta was normal, not dilated, and non-diseased. Aortic root: The aortic root was normal in size.  ------------------------------------------------------------------- Mitral valve:   Mildly thickened leaflets . Mobility was not restricted.  Doppler:  Transvalvular velocity was within the normal range. There was no evidence for stenosis. There was trivial regurgitation.  ------------------------------------------------------------------- Left atrium:  The atrium was normal in size.  ------------------------------------------------------------------- Atrial septum:  No defect or patent foramen ovale was identified.  ------------------------------------------------------------------- Right ventricle:  The cavity size was normal. Wall thickness was normal. Systolic function was normal.  ------------------------------------------------------------------- Pulmonic valve:    Doppler:  Transvalvular velocity was within the normal range. There was no evidence for stenosis. There was mild regurgitation.  ------------------------------------------------------------------- Tricuspid valve:   Structurally normal valve.    Doppler: Transvalvular velocity  was within the normal range. There was mild regurgitation.  ------------------------------------------------------------------- Pulmonary artery:   The main pulmonary artery was normal-sized. Systolic pressure was within the normal range.  ------------------------------------------------------------------- Right atrium:  The atrium was normal in size.  ------------------------------------------------------------------- Pericardium:  The pericardium was normal in appearance. There was no pericardial effusion.  ------------------------------------------------------------------- Systemic veins: Inferior vena cava: The vessel was normal in size. The respirophasic diameter changes were in the normal range (>= 50%), consistent with normal central venous pressure.  ------------------------------------------------------------------- Post procedure conclusions Ascending Aorta:  - The aorta was normal, not dilated, and non-diseased.  ------------------------------------------------------------------- Measurements  Left ventricle                           Value        Reference LV ID, ED, PLAX chordal                  43    mm     43 - 52 LV ID, ES, PLAX chordal  29    mm     23 - 38 LV fx shortening, PLAX chordal           33    %      >=29 LV PW thickness, ED                      9     mm     ---------- IVS/LV PW ratio, ED                      1            <=1.3 Stroke volume, 2D                        52    ml     ---------- Stroke volume/bsa, 2D                    33    ml/m^2 ---------- LV e&', lateral                           6.53  cm/s   ---------- LV e&', medial                            5.55  cm/s   ---------- LV e&', average                           6.04  cm/s   ----------  Ventricular septum                       Value        Reference IVS thickness, ED                        9     mm     ----------  LVOT                                     Value         Reference LVOT ID, S                               17    mm     ---------- LVOT area                                2.27  cm^2   ---------- LVOT peak velocity, S                    126   cm/s   ---------- LVOT mean velocity, S                    67.5  cm/s   ---------- LVOT VTI, S                              22.7  cm     ---------- LVOT peak gradient, S  6     mm Hg  ----------  Aortic valve                             Value        Reference Aortic regurg pressure half-time         756   ms     ----------  Aorta                                    Value        Reference Aortic root ID, ED                       32    mm     ----------  Left atrium                              Value        Reference LA ID, A-P, ES                           30    mm     ---------- LA ID/bsa, A-P                           1.89  cm/m^2 <=2.2 LA volume, S                             21.9  ml     ---------- LA volume/bsa, S                         13.8  ml/m^2 ---------- LA volume, ES, 1-p A4C                   20.2  ml     ---------- LA volume/bsa, ES, 1-p A4C               12.7  ml/m^2 ---------- LA volume, ES, 1-p A2C                   21.8  ml     ---------- LA volume/bsa, ES, 1-p A2C               13.7  ml/m^2 ----------  Mitral valve                             Value        Reference Mitral deceleration time                 218   ms     150 - 230 Mitral E/A ratio, peak                   0.7          ----------  Pulmonary arteries                       Value        Reference PA pressure, S, DP  3     mm Hg  <=30  Tricuspid valve                          Value        Reference Tricuspid regurg peak velocity           2.18  cm/s   ---------- Tricuspid peak RV-RA gradient            0     mm Hg  ----------  Right atrium                             Value        Reference RA ID, S-I, ES, A4C                      39.2  mm     34 - 49 RA area, ES, A4C                          9     cm^2   8.3 - 19.5 RA volume, ES, A/L                       17.2  ml     ---------- RA volume/bsa, ES, A/L                   10.8  ml/m^2 ----------  Systemic veins                           Value        Reference Estimated CVP                            3     mm Hg  ----------  Right ventricle                          Value        Reference TAPSE                                    13.4  mm     ---------- RV pressure, S, DP                       3     mm Hg  <=30 RV s&', lateral, S                        9.68  cm/s   ----------  Legend: (L)  and  (H)  mark values outside specified reference range.  ------------------------------------------------------------------- Prepared and Electronically Authenticated by  Charlton Haws, M.D. 2018-06-23T17:22:10              EKG:  EKG is  ordered today.  The ekg ordered today demonstrates NSR, HR 88 bpm.   Recent Labs: No results found for requested labs within last 365 days.  Recent Lipid Panel    Component Value Date/Time   CHOL 130 04/04/2019 0938   TRIG 114 04/04/2019 0938   HDL 29 (L) 04/04/2019 0938   CHOLHDL 4.5 (H) 04/04/2019 0938   CHOLHDL  4.8 11/18/2016 0030   VLDL 21 11/18/2016 0030   LDLCALC 80 04/04/2019 0938     Risk Assessment/Calculations:                Physical Exam:    VS:  BP 120/80 (BP Location: Left Arm, Patient Position: Sitting, Cuff Size: Normal)   Pulse 88   Ht 5\' 2"  (1.575 m)   Wt 114 lb (51.7 kg)   SpO2 98%   BMI 20.85 kg/m     Wt Readings from Last 3 Encounters:  09/19/22 114 lb (51.7 kg)  09/13/22 112 lb (50.8 kg)  08/30/22 106 lb (48.1 kg)     GEN:  Well nourished, well developed in no acute distress HEENT: Normal NECK: No JVD; No carotid bruits LYMPHATICS: No lymphadenopathy CARDIAC: RRR, 2/6 murmur LSB, no rubs, no gallops RESPIRATORY:  Clear to auscultation without rales, wheezing or rhonchi  ABDOMEN: Soft, non-tender, non-distended MUSCULOSKELETAL:  No edema; No  deformity  SKIN: Warm and dry NEUROLOGIC:  Alert and oriented x 3 PSYCHIATRIC:  Normal affect   ASSESSMENT:    1. Coronary artery disease involving native coronary artery of native heart without angina pectoris   2. Mixed hyperlipidemia   3. Tobacco abuse   4. Ascending aorta dilatation   5. Preoperative cardiovascular examination    PLAN:    In order of problems listed above:  CAD-inferior STEMI s/p DES to RCA 2018, repeat MPI 2019 without ischemia. Stable with no anginal symptoms. No indication for ischemic evaluation.  Continue Plavix 75 mg daily, continue atorvastatin 80 mg daily, continue metoprolol 25 mg twice daily, continue nitroglycerin as needed--has not needed in the last few years.  Hyperlipidemia-no recent LDL on record for my review however she states that her PCP manages this for her.  Continue atorvastatin 80 mg daily.  Ascending aorta dilatation-repeat CT of her chest this year revealed 4.0 cm, no progression noted.  Continue with good blood pressure control.  Tobacco abuse-complete cessation now, congratulated her on that and encouraged continued cessation.   Hyperparathyroidism - upcoming parathyroidectomy on 10/20/22 with Dr. Verne Spurr.   Preoperative cardiovascular examination - According to the Revised Cardiac Risk Index (RCRI), her Perioperative Risk of Major Cardiac Event is (%): 0.9 Her Functional Capacity in METs is: 6.79 according to the Duke Activity Status Index (DASI). Therefore, based on ACC/AHA guidelines, patient would be at acceptable risk for the planned procedure without further cardiovascular testing. I will route this recommendation to the requesting party via Epic fax function.  Regarding her Plavix therapy, it is our recommendation that it can be held for 5 days prior to surgery and resumed as soon as safe per surgeons discretion.   Disposition - return in 6 months.           Medication Adjustments/Labs and Tests Ordered: Current medicines  are reviewed at length with the patient today.  Concerns regarding medicines are outlined above.  Orders Placed This Encounter  Procedures   EKG 12-Lead   No orders of the defined types were placed in this encounter.   Patient Instructions  Medication Instructions:  Your physician recommends that you continue on your current medications as directed. Please refer to the Current Medication list given to you today.  *If you need a refill on your cardiac medications before your next appointment, please call your pharmacy*   Lab Work: NONE If you have labs (blood work) drawn today and your tests are completely normal, you will receive your results only by:  MyChart Message (if you have MyChart) OR A paper copy in the mail If you have any lab test that is abnormal or we need to change your treatment, we will call you to review the results.   Testing/Procedures: NONE   Follow-Up: At [facility], you and your health needs are our priority.  As part of our continuing mission to provide you with exceptional heart care, we have created designated Provider Care Teams.  These Care Teams include your primary Cardiologist (physician) and Advanced Practice Providers (APPs -  Physician Assistants and Nurse Practitioners) who all work together to provide you with the care you need, when you need it.  We recommend signing up for the patient portal called "MyChart".  Sign up information is provided on this After Visit Summary.  MyChart is used to connect with patients for Virtual Visits (Telemedicine).  Patients are able to view lab/test results, encounter notes, upcoming appointments, etc.  Non-urgent messages can be sent to your provider as well.   To learn more about what you can do with MyChart, go to [email or web address].    Your next appointment:   6 month(s)  Provider:   Belva Crome, MD    Other Instructions     Signed, Flossie Dibble, NP  09/19/2022 4:44 PM    [facility]

## 2022-09-19 ENCOUNTER — Encounter: Payer: Self-pay | Admitting: Cardiology

## 2022-09-19 ENCOUNTER — Ambulatory Visit: Payer: Medicare Other | Attending: Cardiology | Admitting: Cardiology

## 2022-09-19 VITALS — BP 120/80 | HR 88 | Ht 62.0 in | Wt 114.0 lb

## 2022-09-19 DIAGNOSIS — I2510 Atherosclerotic heart disease of native coronary artery without angina pectoris: Secondary | ICD-10-CM

## 2022-09-19 DIAGNOSIS — Z01810 Encounter for preprocedural cardiovascular examination: Secondary | ICD-10-CM | POA: Diagnosis present

## 2022-09-19 DIAGNOSIS — Z720 Tobacco use: Secondary | ICD-10-CM

## 2022-09-19 DIAGNOSIS — I77810 Thoracic aortic ectasia: Secondary | ICD-10-CM

## 2022-09-19 DIAGNOSIS — E782 Mixed hyperlipidemia: Secondary | ICD-10-CM

## 2022-09-19 NOTE — Patient Instructions (Signed)
Medication Instructions:  Your physician recommends that you continue on your current medications as directed. Please refer to the Current Medication list given to you today.  *If you need a refill on your cardiac medications before your next appointment, please call your pharmacy*   Lab Work: NONE If you have labs (blood work) drawn today and your tests are completely normal, you will receive your results only by: MyChart Message (if you have MyChart) OR A paper copy in the mail If you have any lab test that is abnormal or we need to change your treatment, we will call you to review the results.   Testing/Procedures: NONE   Follow-Up: At [facility], you and your health needs are our priority.  As part of our continuing mission to provide you with exceptional heart care, we have created designated Provider Care Teams.  These Care Teams include your primary Cardiologist (physician) and Advanced Practice Providers (APPs -  Physician Assistants and Nurse Practitioners) who all work together to provide you with the care you need, when you need it.  We recommend signing up for the patient portal called "MyChart".  Sign up information is provided on this After Visit Summary.  MyChart is used to connect with patients for Virtual Visits (Telemedicine).  Patients are able to view lab/test results, encounter notes, upcoming appointments, etc.  Non-urgent messages can be sent to your provider as well.   To learn more about what you can do with MyChart, go to [email or web address].    Your next appointment:   6 month(s)  Provider:   Rajan Revankar, MD    Other Instructions   

## 2022-09-27 ENCOUNTER — Ambulatory Visit: Payer: Self-pay

## 2022-09-27 NOTE — Patient Outreach (Signed)
  Care Coordination   Follow Up Visit Note   09/27/2022 Name: Daija Routson MRN: [id number] DOB: [birth year]  Leyana Whidden is a 77 y.o. year old female who sees Buckner Malta, MD for primary care. I spoke with  [name] by phone today.  What matters to the patients health and wellness today?  Follow up call with patient today. Reviewed with patient how her recent testing went. Reports pulmonary nodules. She has been referred to pulmonary.  Reports she is having her parathyroid surgery on 10/20/2022.  Reports outpatient and hopes to come home the same day.  Reports she is going to be out of work 1 week.  Patient in good spirits.     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 Reviewed avoiding falls. Assessed pain.]  Provided Verbal Education On Nutrition, Exercise, Medication, When to see the doctor  Nutrition Interventions   Nutrition Discussed/Reviewed Nutrition Discussed  Pharmacy Interventions   Pharmacy Dicussed/Reviewed Medications and their functions  Safety Interventions   Safety Discussed/Reviewed Fall Risk      Reviewed with patient her plan of care for surgery and then see what is next. She continues to work and is in good spirits.   Encouraged patient to call me if needed, otherwise plan to talk with her after surgery.        SDOH assessments and interventions completed:  No     Care Coordination  Interventions:  Yes, provided   Follow up plan: Follow up call scheduled for 10/25/2022    Encounter Outcome:  Pt. Visit Completed   Rowe Pavy, RN, BSN, CEN [facility] Coordinator [phone]

## 2022-09-29 DIAGNOSIS — E213 Hyperparathyroidism, unspecified: Secondary | ICD-10-CM | POA: Diagnosis not present

## 2022-09-29 DIAGNOSIS — E042 Nontoxic multinodular goiter: Secondary | ICD-10-CM | POA: Diagnosis not present

## 2022-10-20 DIAGNOSIS — F17210 Nicotine dependence, cigarettes, uncomplicated: Secondary | ICD-10-CM | POA: Diagnosis not present

## 2022-10-20 DIAGNOSIS — Z8249 Family history of ischemic heart disease and other diseases of the circulatory system: Secondary | ICD-10-CM | POA: Diagnosis not present

## 2022-10-20 DIAGNOSIS — E213 Hyperparathyroidism, unspecified: Secondary | ICD-10-CM | POA: Diagnosis not present

## 2022-10-20 DIAGNOSIS — D351 Benign neoplasm of parathyroid gland: Secondary | ICD-10-CM | POA: Diagnosis not present

## 2022-10-20 DIAGNOSIS — E7800 Pure hypercholesterolemia, unspecified: Secondary | ICD-10-CM | POA: Diagnosis not present

## 2022-10-21 DIAGNOSIS — Z5189 Encounter for other specified aftercare: Secondary | ICD-10-CM | POA: Diagnosis not present

## 2022-10-21 DIAGNOSIS — Z8249 Family history of ischemic heart disease and other diseases of the circulatory system: Secondary | ICD-10-CM | POA: Diagnosis not present

## 2022-10-21 DIAGNOSIS — F17210 Nicotine dependence, cigarettes, uncomplicated: Secondary | ICD-10-CM | POA: Diagnosis not present

## 2022-10-21 DIAGNOSIS — E213 Hyperparathyroidism, unspecified: Secondary | ICD-10-CM | POA: Diagnosis not present

## 2022-10-21 DIAGNOSIS — E7800 Pure hypercholesterolemia, unspecified: Secondary | ICD-10-CM | POA: Diagnosis not present

## 2022-10-25 ENCOUNTER — Ambulatory Visit: Payer: Self-pay

## 2022-10-25 NOTE — Patient Outreach (Signed)
  Care Coordination   Follow Up Visit Note   10/25/2022 Name: Tiffany Wright MRN: [id number] DOB: May 26, 1946  Tiffany Wright is a 77 y.o. year old female who sees Buckner Malta, MD for primary care. I spoke with  [name] by phone today.  What matters to the patients health and wellness today?  Patient reports that her parathyroid surgery went well.  Reports only 1 lobe on the top right was removed.  Reports that she stayed overnight and then came home. Reports minimal amount of pain.  Reports she is eating and drinking well. Patient reports that she is feeling more alert and less groggy.  Reports no back pain.  States that she goes back to work on 10/31/2022.  Patient has an initial visit with pulmonary upcoming to evaluate an abnormality on CT.    Goals Addressed               This Visit's Progress     I have osteoporosis (pt-stated)        Interventions Today    Flowsheet Row Most Recent Value  Chronic Disease   Chronic disease during today's visit Other  [osteoporosis and parathyroid tumor]  General Interventions   General Interventions Discussed/Reviewed General Interventions Discussed  Exercise Interventions   Exercise Discussed/Reviewed Physical Activity  [reviewed limit of lifting]  Nutrition Interventions   Nutrition Discussed/Reviewed Nutrition Discussed  Pharmacy Interventions   Pharmacy Dicussed/Reviewed Medications and their functions  [patient discussed starting an injectable medication for her bones.  Did not know name but states that she is going to get financial assistance with the cost. She has a follow up plan with ortho.]  Safety Interventions   Safety Discussed/Reviewed Fall Risk              SDOH assessments and interventions completed:  No     Care Coordination Interventions:  Yes, provided   Follow up plan: Follow up call scheduled for 11/15/2022    Encounter Outcome:  Pt. Visit Completed   Rowe Pavy, RN, BSN, CEN [facility]  Coordinator [phone]

## 2022-10-30 DIAGNOSIS — F17210 Nicotine dependence, cigarettes, uncomplicated: Secondary | ICD-10-CM | POA: Diagnosis not present

## 2022-10-30 DIAGNOSIS — R918 Other nonspecific abnormal finding of lung field: Secondary | ICD-10-CM | POA: Diagnosis not present

## 2022-10-30 DIAGNOSIS — E213 Hyperparathyroidism, unspecified: Secondary | ICD-10-CM | POA: Diagnosis not present

## 2022-11-02 DIAGNOSIS — E213 Hyperparathyroidism, unspecified: Secondary | ICD-10-CM | POA: Diagnosis not present

## 2022-11-15 ENCOUNTER — Ambulatory Visit: Payer: Self-pay

## 2022-11-15 NOTE — Patient Outreach (Signed)
  Care Coordination   Follow Up Visit Note   11/15/2022 Name: Ronnika Pinch MRN: [id number] DOB: 10/27/1945  Riyanna Radka is a 77 y.o. year old female who sees Buckner Malta, MD for primary care. I spoke with  [name] by phone today.  What matters to the patients health and wellness today?  Patient reports that she is doing well. Reports that she is having a good day.  Patient will start her injections for her bones today. She is waiting for the courier to come.  Reports she had a great vacation with her grandson.  Patient reports that she has started back to work. Denies any changes to medications.  Denies any new problems.     Goals Addressed               This Visit's Progress     I have osteoporosis (pt-stated)        Interventions Today    Flowsheet Row Most Recent Value  Chronic Disease   Chronic disease during today's visit Other  [osteoporosis]  General Interventions   General Interventions Discussed/Reviewed General Interventions Reviewed  Exercise Interventions   Exercise Discussed/Reviewed Physical Activity  Education Interventions   Education Provided Provided Education  [Reviewed with patient fall prevention and new medication for her bones.]  Pharmacy Interventions   Pharmacy Dicussed/Reviewed Medications and their functions  Safety Interventions   Safety Discussed/Reviewed Fall Risk            Provided my contact information to patient is she needs to call me sooner than our scheduled appointment.   SDOH assessments and interventions completed:  No     Care Coordination Interventions:  Yes, provided   Follow up plan: Follow up call scheduled for 01/17/2023    Encounter Outcome:  Pt. Visit Completed   Rowe Pavy, RN, BSN, CEN [facility] Coordinator [phone]

## 2022-12-27 DIAGNOSIS — Z9889 Other specified postprocedural states: Secondary | ICD-10-CM | POA: Diagnosis not present

## 2022-12-27 DIAGNOSIS — Z9089 Acquired absence of other organs: Secondary | ICD-10-CM | POA: Diagnosis not present

## 2022-12-27 DIAGNOSIS — M8000XD Age-related osteoporosis with current pathological fracture, unspecified site, subsequent encounter for fracture with routine healing: Secondary | ICD-10-CM | POA: Diagnosis not present

## 2022-12-27 DIAGNOSIS — Z7189 Other specified counseling: Secondary | ICD-10-CM | POA: Diagnosis not present

## 2023-01-17 ENCOUNTER — Ambulatory Visit: Payer: Self-pay

## 2023-01-17 NOTE — Patient Outreach (Signed)
  Care Coordination   01/17/2023 Name: Tiffany Wright MRN: [id number] DOB: 1945/09/03   Care Coordination Outreach Attempts:  An unsuccessful telephone outreach was attempted for a scheduled appointment today.  Follow Up Plan:  Additional outreach attempts will be made to offer the patient care coordination information and services.   Encounter Outcome:  No Answer   Care Coordination Interventions:  No, not indicated    Rowe Pavy, RN, BSN, [facility] [phone]

## 2023-02-01 ENCOUNTER — Telehealth: Payer: Self-pay | Admitting: *Deleted

## 2023-02-01 NOTE — Progress Notes (Signed)
  Care Coordination Note  02/01/2023 Name: Tiffany Wright MRN: [id number] DOB: 11/09/1945  Tiffany Wright is a 77 y.o. year old female who is a primary care patient of Burgart, Victorino Dike, MD and is actively engaged with the care management team. I reached out to [name] by phone today to assist with re-scheduling a follow up visit with the RN Case Manager  Follow up plan: We have been unable to make contact with the patient for follow up.    Burman Nieves, CCMA Care Coordination Care Guide Direct Dial: [phone]

## 2023-06-12 DIAGNOSIS — R051 Acute cough: Secondary | ICD-10-CM | POA: Diagnosis not present

## 2023-06-12 DIAGNOSIS — R300 Dysuria: Secondary | ICD-10-CM | POA: Diagnosis not present

## 2023-06-12 DIAGNOSIS — R0981 Nasal congestion: Secondary | ICD-10-CM | POA: Diagnosis not present

## 2023-06-12 DIAGNOSIS — R0600 Dyspnea, unspecified: Secondary | ICD-10-CM | POA: Diagnosis not present

## 2023-06-12 DIAGNOSIS — N3001 Acute cystitis with hematuria: Secondary | ICD-10-CM | POA: Diagnosis not present

## 2023-06-27 DIAGNOSIS — N3000 Acute cystitis without hematuria: Secondary | ICD-10-CM | POA: Diagnosis not present

## 2023-06-27 DIAGNOSIS — M4850XA Collapsed vertebra, not elsewhere classified, site unspecified, initial encounter for fracture: Secondary | ICD-10-CM | POA: Diagnosis not present

## 2023-06-27 DIAGNOSIS — Z79899 Other long term (current) drug therapy: Secondary | ICD-10-CM | POA: Diagnosis not present

## 2023-06-27 DIAGNOSIS — I25119 Atherosclerotic heart disease of native coronary artery with unspecified angina pectoris: Secondary | ICD-10-CM | POA: Diagnosis not present

## 2023-06-27 DIAGNOSIS — Z13220 Encounter for screening for lipoid disorders: Secondary | ICD-10-CM | POA: Diagnosis not present

## 2023-06-27 DIAGNOSIS — R300 Dysuria: Secondary | ICD-10-CM | POA: Diagnosis not present

## 2023-06-27 DIAGNOSIS — E213 Hyperparathyroidism, unspecified: Secondary | ICD-10-CM | POA: Diagnosis not present

## 2023-06-27 DIAGNOSIS — D692 Other nonthrombocytopenic purpura: Secondary | ICD-10-CM | POA: Diagnosis not present

## 2023-07-25 DIAGNOSIS — R110 Nausea: Secondary | ICD-10-CM | POA: Diagnosis not present

## 2023-07-25 DIAGNOSIS — R0981 Nasal congestion: Secondary | ICD-10-CM | POA: Diagnosis not present

## 2023-07-25 DIAGNOSIS — R051 Acute cough: Secondary | ICD-10-CM | POA: Diagnosis not present

## 2023-07-30 DIAGNOSIS — R112 Nausea with vomiting, unspecified: Secondary | ICD-10-CM | POA: Diagnosis not present

## 2023-07-30 DIAGNOSIS — K921 Melena: Secondary | ICD-10-CM | POA: Diagnosis not present

## 2023-07-30 DIAGNOSIS — K591 Functional diarrhea: Secondary | ICD-10-CM | POA: Diagnosis not present

## 2023-08-15 DIAGNOSIS — D692 Other nonthrombocytopenic purpura: Secondary | ICD-10-CM | POA: Diagnosis not present

## 2023-08-15 DIAGNOSIS — I25119 Atherosclerotic heart disease of native coronary artery with unspecified angina pectoris: Secondary | ICD-10-CM | POA: Diagnosis not present

## 2023-08-15 DIAGNOSIS — Z0000 Encounter for general adult medical examination without abnormal findings: Secondary | ICD-10-CM | POA: Diagnosis not present

## 2023-08-15 DIAGNOSIS — M4850XA Collapsed vertebra, not elsewhere classified, site unspecified, initial encounter for fracture: Secondary | ICD-10-CM | POA: Diagnosis not present

## 2023-08-15 DIAGNOSIS — Z79899 Other long term (current) drug therapy: Secondary | ICD-10-CM | POA: Diagnosis not present

## 2023-08-15 DIAGNOSIS — E213 Hyperparathyroidism, unspecified: Secondary | ICD-10-CM | POA: Diagnosis not present

## 2023-08-15 DIAGNOSIS — R7302 Impaired glucose tolerance (oral): Secondary | ICD-10-CM | POA: Diagnosis not present

## 2023-08-27 DIAGNOSIS — S0083XA Contusion of other part of head, initial encounter: Secondary | ICD-10-CM | POA: Diagnosis not present

## 2023-11-14 DIAGNOSIS — I25119 Atherosclerotic heart disease of native coronary artery with unspecified angina pectoris: Secondary | ICD-10-CM | POA: Diagnosis not present

## 2023-11-14 DIAGNOSIS — K219 Gastro-esophageal reflux disease without esophagitis: Secondary | ICD-10-CM | POA: Diagnosis not present

## 2023-11-14 DIAGNOSIS — M810 Age-related osteoporosis without current pathological fracture: Secondary | ICD-10-CM | POA: Diagnosis not present

## 2023-11-14 DIAGNOSIS — F4321 Adjustment disorder with depressed mood: Secondary | ICD-10-CM | POA: Diagnosis not present

## 2023-11-21 DIAGNOSIS — Z9889 Other specified postprocedural states: Secondary | ICD-10-CM | POA: Diagnosis not present

## 2023-11-21 DIAGNOSIS — Z5181 Encounter for therapeutic drug level monitoring: Secondary | ICD-10-CM | POA: Diagnosis not present

## 2023-11-21 DIAGNOSIS — Z7189 Other specified counseling: Secondary | ICD-10-CM | POA: Diagnosis not present

## 2023-11-21 DIAGNOSIS — M8000XD Age-related osteoporosis with current pathological fracture, unspecified site, subsequent encounter for fracture with routine healing: Secondary | ICD-10-CM | POA: Diagnosis not present

## 2023-11-21 DIAGNOSIS — Z9089 Acquired absence of other organs: Secondary | ICD-10-CM | POA: Diagnosis not present

## 2023-11-28 DIAGNOSIS — R072 Precordial pain: Secondary | ICD-10-CM | POA: Diagnosis not present

## 2023-12-23 DIAGNOSIS — L255 Unspecified contact dermatitis due to plants, except food: Secondary | ICD-10-CM | POA: Diagnosis not present

## 2024-01-24 DIAGNOSIS — Z6821 Body mass index (BMI) 21.0-21.9, adult: Secondary | ICD-10-CM | POA: Diagnosis not present

## 2024-01-24 DIAGNOSIS — H00013 Hordeolum externum right eye, unspecified eyelid: Secondary | ICD-10-CM | POA: Diagnosis not present

## 2024-01-24 DIAGNOSIS — F4321 Adjustment disorder with depressed mood: Secondary | ICD-10-CM | POA: Diagnosis not present

## 2024-02-27 DIAGNOSIS — J329 Chronic sinusitis, unspecified: Secondary | ICD-10-CM | POA: Diagnosis not present

## 2024-02-27 DIAGNOSIS — I10 Essential (primary) hypertension: Secondary | ICD-10-CM | POA: Diagnosis not present

## 2024-02-27 DIAGNOSIS — R0989 Other specified symptoms and signs involving the circulatory and respiratory systems: Secondary | ICD-10-CM | POA: Diagnosis not present

## 2024-02-27 DIAGNOSIS — M810 Age-related osteoporosis without current pathological fracture: Secondary | ICD-10-CM | POA: Diagnosis not present

## 2024-02-27 DIAGNOSIS — J40 Bronchitis, not specified as acute or chronic: Secondary | ICD-10-CM | POA: Diagnosis not present

## 2024-03-04 DIAGNOSIS — R0982 Postnasal drip: Secondary | ICD-10-CM | POA: Diagnosis not present

## 2024-03-04 DIAGNOSIS — Z6821 Body mass index (BMI) 21.0-21.9, adult: Secondary | ICD-10-CM | POA: Diagnosis not present

## 2024-03-04 DIAGNOSIS — R058 Other specified cough: Secondary | ICD-10-CM | POA: Diagnosis not present

## 2024-03-04 DIAGNOSIS — J309 Allergic rhinitis, unspecified: Secondary | ICD-10-CM | POA: Diagnosis not present

## 2024-05-14 DIAGNOSIS — M542 Cervicalgia: Secondary | ICD-10-CM | POA: Diagnosis not present
